# Patient Record
Sex: Female | Born: 1985 | Race: White | Hispanic: No | Marital: Single | Smoking: Never smoker
Health system: Southern US, Community
[De-identification: ages and names within clinical notes are randomized; demographics above are authoritative.]

## PROBLEM LIST (undated history)

## (undated) DIAGNOSIS — F419 Anxiety disorder, unspecified: Secondary | ICD-10-CM

## (undated) DIAGNOSIS — F32A Depression, unspecified: Secondary | ICD-10-CM

## (undated) DIAGNOSIS — E039 Hypothyroidism, unspecified: Secondary | ICD-10-CM

## (undated) DIAGNOSIS — F329 Major depressive disorder, single episode, unspecified: Secondary | ICD-10-CM

## (undated) DIAGNOSIS — U071 COVID-19: Secondary | ICD-10-CM

## (undated) HISTORY — DX: Depression, unspecified: F32.A

## (undated) HISTORY — DX: COVID-19: U07.1

## (undated) HISTORY — DX: Hypothyroidism, unspecified: E03.9

## (undated) HISTORY — DX: Anxiety disorder, unspecified: F41.9

---
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
## 2005-01-20 ENCOUNTER — Ambulatory Visit: Payer: Self-pay | Admitting: Psychology

## 2005-03-12 ENCOUNTER — Ambulatory Visit: Payer: Self-pay | Admitting: Psychology

## 2005-05-29 ENCOUNTER — Ambulatory Visit: Payer: Self-pay | Admitting: Psychology

## 2005-07-29 ENCOUNTER — Ambulatory Visit: Payer: Self-pay | Admitting: Psychology

## 2005-08-05 ENCOUNTER — Ambulatory Visit: Payer: Self-pay | Admitting: Psychology

## 2005-09-15 ENCOUNTER — Ambulatory Visit: Payer: Self-pay | Admitting: Psychology

## 2018-06-24 DIAGNOSIS — Z0000 Encounter for general adult medical examination without abnormal findings: Secondary | ICD-10-CM | POA: Diagnosis not present

## 2018-06-24 DIAGNOSIS — R300 Dysuria: Secondary | ICD-10-CM | POA: Diagnosis not present

## 2018-06-24 DIAGNOSIS — N951 Menopausal and female climacteric states: Secondary | ICD-10-CM | POA: Diagnosis not present

## 2018-06-24 DIAGNOSIS — R358 Other polyuria: Secondary | ICD-10-CM | POA: Diagnosis not present

## 2018-06-24 DIAGNOSIS — R5383 Other fatigue: Secondary | ICD-10-CM | POA: Diagnosis not present

## 2018-06-24 DIAGNOSIS — E559 Vitamin D deficiency, unspecified: Secondary | ICD-10-CM | POA: Diagnosis not present

## 2018-07-16 DIAGNOSIS — S99912A Unspecified injury of left ankle, initial encounter: Secondary | ICD-10-CM | POA: Diagnosis not present

## 2018-07-16 DIAGNOSIS — S8012XA Contusion of left lower leg, initial encounter: Secondary | ICD-10-CM | POA: Diagnosis not present

## 2018-07-16 DIAGNOSIS — S8992XA Unspecified injury of left lower leg, initial encounter: Secondary | ICD-10-CM | POA: Diagnosis not present

## 2018-07-16 DIAGNOSIS — Y92414 Local residential or business street as the place of occurrence of the external cause: Secondary | ICD-10-CM | POA: Diagnosis not present

## 2018-07-16 DIAGNOSIS — M79662 Pain in left lower leg: Secondary | ICD-10-CM | POA: Diagnosis not present

## 2018-08-12 DIAGNOSIS — M9901 Segmental and somatic dysfunction of cervical region: Secondary | ICD-10-CM | POA: Diagnosis not present

## 2018-08-12 DIAGNOSIS — M9902 Segmental and somatic dysfunction of thoracic region: Secondary | ICD-10-CM | POA: Diagnosis not present

## 2018-08-12 DIAGNOSIS — M9903 Segmental and somatic dysfunction of lumbar region: Secondary | ICD-10-CM | POA: Diagnosis not present

## 2018-08-12 DIAGNOSIS — S138XXA Sprain of joints and ligaments of other parts of neck, initial encounter: Secondary | ICD-10-CM | POA: Diagnosis not present

## 2018-08-16 DIAGNOSIS — S138XXA Sprain of joints and ligaments of other parts of neck, initial encounter: Secondary | ICD-10-CM | POA: Diagnosis not present

## 2018-08-16 DIAGNOSIS — M9903 Segmental and somatic dysfunction of lumbar region: Secondary | ICD-10-CM | POA: Diagnosis not present

## 2018-08-16 DIAGNOSIS — M9901 Segmental and somatic dysfunction of cervical region: Secondary | ICD-10-CM | POA: Diagnosis not present

## 2018-08-16 DIAGNOSIS — M9902 Segmental and somatic dysfunction of thoracic region: Secondary | ICD-10-CM | POA: Diagnosis not present

## 2018-08-17 DIAGNOSIS — M9903 Segmental and somatic dysfunction of lumbar region: Secondary | ICD-10-CM | POA: Diagnosis not present

## 2018-08-17 DIAGNOSIS — M9902 Segmental and somatic dysfunction of thoracic region: Secondary | ICD-10-CM | POA: Diagnosis not present

## 2018-08-17 DIAGNOSIS — M9901 Segmental and somatic dysfunction of cervical region: Secondary | ICD-10-CM | POA: Diagnosis not present

## 2018-08-17 DIAGNOSIS — S138XXA Sprain of joints and ligaments of other parts of neck, initial encounter: Secondary | ICD-10-CM | POA: Diagnosis not present

## 2018-08-18 DIAGNOSIS — S138XXA Sprain of joints and ligaments of other parts of neck, initial encounter: Secondary | ICD-10-CM | POA: Diagnosis not present

## 2018-08-18 DIAGNOSIS — M9903 Segmental and somatic dysfunction of lumbar region: Secondary | ICD-10-CM | POA: Diagnosis not present

## 2018-08-18 DIAGNOSIS — M9902 Segmental and somatic dysfunction of thoracic region: Secondary | ICD-10-CM | POA: Diagnosis not present

## 2018-08-18 DIAGNOSIS — M9901 Segmental and somatic dysfunction of cervical region: Secondary | ICD-10-CM | POA: Diagnosis not present

## 2018-08-19 DIAGNOSIS — M9901 Segmental and somatic dysfunction of cervical region: Secondary | ICD-10-CM | POA: Diagnosis not present

## 2018-08-19 DIAGNOSIS — S138XXA Sprain of joints and ligaments of other parts of neck, initial encounter: Secondary | ICD-10-CM | POA: Diagnosis not present

## 2018-08-19 DIAGNOSIS — M9902 Segmental and somatic dysfunction of thoracic region: Secondary | ICD-10-CM | POA: Diagnosis not present

## 2018-08-19 DIAGNOSIS — M9903 Segmental and somatic dysfunction of lumbar region: Secondary | ICD-10-CM | POA: Diagnosis not present

## 2018-08-24 DIAGNOSIS — M9903 Segmental and somatic dysfunction of lumbar region: Secondary | ICD-10-CM | POA: Diagnosis not present

## 2018-08-24 DIAGNOSIS — M9902 Segmental and somatic dysfunction of thoracic region: Secondary | ICD-10-CM | POA: Diagnosis not present

## 2018-08-24 DIAGNOSIS — S138XXA Sprain of joints and ligaments of other parts of neck, initial encounter: Secondary | ICD-10-CM | POA: Diagnosis not present

## 2018-08-24 DIAGNOSIS — M9901 Segmental and somatic dysfunction of cervical region: Secondary | ICD-10-CM | POA: Diagnosis not present

## 2018-08-25 DIAGNOSIS — M9903 Segmental and somatic dysfunction of lumbar region: Secondary | ICD-10-CM | POA: Diagnosis not present

## 2018-08-25 DIAGNOSIS — S138XXA Sprain of joints and ligaments of other parts of neck, initial encounter: Secondary | ICD-10-CM | POA: Diagnosis not present

## 2018-08-25 DIAGNOSIS — M9902 Segmental and somatic dysfunction of thoracic region: Secondary | ICD-10-CM | POA: Diagnosis not present

## 2018-08-25 DIAGNOSIS — M9901 Segmental and somatic dysfunction of cervical region: Secondary | ICD-10-CM | POA: Diagnosis not present

## 2018-08-26 DIAGNOSIS — M9903 Segmental and somatic dysfunction of lumbar region: Secondary | ICD-10-CM | POA: Diagnosis not present

## 2018-08-26 DIAGNOSIS — M9902 Segmental and somatic dysfunction of thoracic region: Secondary | ICD-10-CM | POA: Diagnosis not present

## 2018-08-26 DIAGNOSIS — S138XXA Sprain of joints and ligaments of other parts of neck, initial encounter: Secondary | ICD-10-CM | POA: Diagnosis not present

## 2018-08-26 DIAGNOSIS — M9901 Segmental and somatic dysfunction of cervical region: Secondary | ICD-10-CM | POA: Diagnosis not present

## 2018-08-30 DIAGNOSIS — M9903 Segmental and somatic dysfunction of lumbar region: Secondary | ICD-10-CM | POA: Diagnosis not present

## 2018-08-30 DIAGNOSIS — M9902 Segmental and somatic dysfunction of thoracic region: Secondary | ICD-10-CM | POA: Diagnosis not present

## 2018-08-30 DIAGNOSIS — S138XXA Sprain of joints and ligaments of other parts of neck, initial encounter: Secondary | ICD-10-CM | POA: Diagnosis not present

## 2018-08-30 DIAGNOSIS — M9901 Segmental and somatic dysfunction of cervical region: Secondary | ICD-10-CM | POA: Diagnosis not present

## 2018-08-31 DIAGNOSIS — S138XXA Sprain of joints and ligaments of other parts of neck, initial encounter: Secondary | ICD-10-CM | POA: Diagnosis not present

## 2018-08-31 DIAGNOSIS — M9903 Segmental and somatic dysfunction of lumbar region: Secondary | ICD-10-CM | POA: Diagnosis not present

## 2018-08-31 DIAGNOSIS — M9901 Segmental and somatic dysfunction of cervical region: Secondary | ICD-10-CM | POA: Diagnosis not present

## 2018-08-31 DIAGNOSIS — M9902 Segmental and somatic dysfunction of thoracic region: Secondary | ICD-10-CM | POA: Diagnosis not present

## 2018-09-01 DIAGNOSIS — M9901 Segmental and somatic dysfunction of cervical region: Secondary | ICD-10-CM | POA: Diagnosis not present

## 2018-09-01 DIAGNOSIS — M9903 Segmental and somatic dysfunction of lumbar region: Secondary | ICD-10-CM | POA: Diagnosis not present

## 2018-09-01 DIAGNOSIS — S138XXA Sprain of joints and ligaments of other parts of neck, initial encounter: Secondary | ICD-10-CM | POA: Diagnosis not present

## 2018-09-01 DIAGNOSIS — M9902 Segmental and somatic dysfunction of thoracic region: Secondary | ICD-10-CM | POA: Diagnosis not present

## 2018-09-03 ENCOUNTER — Emergency Department (HOSPITAL_COMMUNITY): Payer: BLUE CROSS/BLUE SHIELD

## 2018-09-03 ENCOUNTER — Encounter (HOSPITAL_COMMUNITY): Payer: Self-pay | Admitting: Emergency Medicine

## 2018-09-03 ENCOUNTER — Other Ambulatory Visit: Payer: Self-pay

## 2018-09-03 ENCOUNTER — Emergency Department (HOSPITAL_COMMUNITY)
Admission: EM | Admit: 2018-09-03 | Discharge: 2018-09-03 | Disposition: A | Discharge status: Home / Self Care | Payer: BLUE CROSS/BLUE SHIELD | Attending: Emergency Medicine | Admitting: Emergency Medicine

## 2018-09-03 DIAGNOSIS — N200 Calculus of kidney: Secondary | ICD-10-CM | POA: Diagnosis not present

## 2018-09-03 DIAGNOSIS — R1031 Right lower quadrant pain: Secondary | ICD-10-CM | POA: Diagnosis not present

## 2018-09-03 DIAGNOSIS — Z79899 Other long term (current) drug therapy: Secondary | ICD-10-CM | POA: Diagnosis not present

## 2018-09-03 DIAGNOSIS — N132 Hydronephrosis with renal and ureteral calculous obstruction: Secondary | ICD-10-CM | POA: Diagnosis not present

## 2018-09-03 LAB — URINALYSIS, ROUTINE W REFLEX MICROSCOPIC
Bilirubin Urine: NEGATIVE
Glucose, UA: NEGATIVE mg/dL
Ketones, ur: NEGATIVE mg/dL
Leukocytes, UA: NEGATIVE
NITRITE: NEGATIVE
Protein, ur: NEGATIVE mg/dL
RBC / HPF: >50 RBC/hpf — ABNORMAL HIGH (ref 0–5)
SPECIFIC GRAVITY, URINE: 1.02 (ref 1.005–1.030)
pH: 5 (ref 5.0–8.0)

## 2018-09-03 LAB — PREGNANCY, URINE: PREG TEST UR: NEGATIVE

## 2018-09-03 MED ORDER — IBUPROFEN 400 MG PO TABS: 400.0000 mg | ORAL_TABLET | Freq: Three times a day (TID) | ORAL | 0 refills | Status: AC

## 2018-09-03 MED ORDER — ONDANSETRON 4 MG PO TBDP: 4.0000 mg | ORAL_TABLET | Freq: Three times a day (TID) | ORAL | 0 refills | Status: DC | PRN

## 2018-09-03 MED ORDER — SODIUM CHLORIDE 0.9 % IV BOLUS
500.0000 mL | Freq: Once | INTRAVENOUS | Status: AC
  Administered 2018-09-03: 500 mL via INTRAVENOUS

## 2018-09-03 MED ORDER — KETOROLAC TROMETHAMINE 30 MG/ML IJ SOLN
15.0000 mg | Freq: Once | INTRAMUSCULAR | Status: AC
  Administered 2018-09-03: 15 mg via INTRAVENOUS
  Filled 2018-09-03: qty 1

## 2018-09-03 MED ORDER — TRAMADOL HCL 50 MG PO TABS: 50.0000 mg | ORAL_TABLET | Freq: Four times a day (QID) | ORAL | 0 refills | Status: DC | PRN

## 2018-09-03 MED ORDER — ONDANSETRON HCL 4 MG/2ML IJ SOLN
4.0000 mg | Freq: Once | INTRAMUSCULAR | Status: AC
  Administered 2018-09-03: 4 mg via INTRAVENOUS
  Filled 2018-09-03: qty 2

## 2018-09-03 NOTE — ED Provider Notes (Signed)
[facility]-EMERGENCY DEPT Provider Note   CSN: [id number] Arrival date & time: 09/03/18  4098     History   Chief Complaint Chief Complaint  Patient presents with  . Flank Pain    HPI Gail Price is a 32 y.o. female.  HPI Previously well young female presents with new right flank pain. Onset was about 5 hours ago, initially with mild discomfort, nausea, but she subsequently developed sharp severe pain in the right flank, with some radiation towards the right lateral abdomen. She has sensation of urinary frequency with minimal production. There is associated generalized discomfort, no vomiting, no diarrhea. She has taken to 200 mg tablets of ibuprofen, without appreciable change in her condition. No history of kidney stone, but she does have a history of kidney infections within the past 18 months. No other chronic medical issues.  History reviewed. No pertinent past medical history.  There are no active problems to display for this patient.   History reviewed. No pertinent surgical history.   OB History   None      Home Medications    Prior to Admission medications   Medication Sig Start Date End Date Taking? Authorizing Provider  Multiple Vitamin (MULTIVITAMIN WITH MINERALS) TABS tablet Take 1 tablet by mouth daily.   Yes [provider]  ibuprofen (ADVIL,MOTRIN) 400 MG tablet Take 1 tablet (400 mg total) by mouth 3 (three) times daily for 3 days. Take one tablet three times daily for three days 09/03/18 09/06/18  Gerhard Munch, MD  ondansetron (ZOFRAN ODT) 4 MG disintegrating tablet Take 1 tablet (4 mg total) by mouth every 8 (eight) hours as needed for nausea or vomiting. 09/03/18   Gerhard Munch, MD  traMADol (ULTRAM) 50 MG tablet Take 1 tablet (50 mg total) by mouth every 6 (six) hours as needed. 09/03/18   Gerhard Munch, MD    Family History No family history on file.  Social History Social History   Tobacco  Use  . Smoking status: Never Smoker  . Smokeless tobacco: Never Used  Substance Use Topics  . Alcohol use: Not Currently  . Drug use: Not Currently     Allergies   Patient has no known allergies.   Review of Systems Review of Systems  Constitutional:       Per HPI, otherwise negative  HENT:       Per HPI, otherwise negative  Respiratory:       Per HPI, otherwise negative  Cardiovascular:       Per HPI, otherwise negative  Gastrointestinal: Positive for nausea. Negative for vomiting.  Endocrine:       Negative aside from HPI  Genitourinary:       Neg aside from HPI   Musculoskeletal:       Per HPI, otherwise negative  Skin: Negative.   Neurological: Negative for syncope.     Physical Exam Updated Vital Signs BP 117/72   Pulse 94   Temp 98.1 F (36.7 C)   Resp 17   LMP 08/31/2018   SpO2 100%   Physical Exam  Constitutional: She is oriented to person, place, and time. She appears well-developed and well-nourished. No distress.  HENT:  Head: Normocephalic and atraumatic.  Eyes: Conjunctivae and EOM are normal.  Cardiovascular: Normal rate and regular rhythm.  Pulmonary/Chest: Effort normal and breath sounds normal. No stridor. No respiratory distress.  Abdominal: She exhibits no distension. There is no tenderness. There is no guarding.  Mild right CVA tenderness  Musculoskeletal: She exhibits no edema.  Neurological: She is alert and oriented to person, place, and time. No cranial nerve deficit.  Skin: Skin is warm and dry.  Psychiatric: She has a normal mood and affect.  Nursing note and vitals reviewed.    ED Treatments / Results  Labs (all labs ordered are listed, but only abnormal results are displayed) Labs Reviewed  URINALYSIS, ROUTINE W REFLEX MICROSCOPIC - Abnormal; Notable for the following components:      Result Value   APPearance HAZY (*)    Hgb urine dipstick LARGE (*)    RBC / HPF >50 (*)    Bacteria, UA RARE (*)    All other  components within normal limits  PREGNANCY, URINE  POC URINE PREG, ED   Radiology Ct Renal Stone Study  Result Date: 09/03/2018 CLINICAL DATA:  Flank pain there is bibasilar atelectatic change. No edema or consolidation. EXAM: CT ABDOMEN AND PELVIS WITHOUT CONTRAST TECHNIQUE: Multidetector CT imaging of the abdomen and pelvis was performed following the standard protocol without IV contrast. COMPARISON:  No focal liver lesions are evident on this noncontrast enhanced study. Gallbladder wall is not appreciably thickened. There is no biliary duct dilatation. FINDINGS: Lower chest: Lung bases are clear. Hepatobiliary: No focal liver lesions are appreciable. Gallbladder wall is not thickened. No biliary duct dilatation. Pancreas: No pancreatic mass or inflammatory focus. Spleen: No splenic lesions evident. Adrenals/Urinary Tract: Adrenals appear normal bilaterally. There is no renal mass on either side. There is a junctional parenchymal defect on each side, an anatomic variant. There is moderate hydronephrosis on the right. There is no hydronephrosis on the left. There is a 2 mm calculus in the upper pole of the right kidney. There is a 2 mm calculus in the lower pole of the right kidney. There is a 1 mm calculus in the upper pole left kidney. There is a 3 mm calculus just proximal to the right ureterovesical junction. No other ureteral calculi are evident. Urinary bladder is midline with wall thickness within normal limits. Stomach/Bowel: There is no appreciable bowel wall or mesenteric thickening. No evident bowel obstruction. No free air or portal venous air. Vascular/Lymphatic: There is no abdominal aortic aneurysm. No vascular lesions are evident on this noncontrast enhanced studies. There is a retroaortic left renal vein, an anatomic variant. There is no adenopathy in the abdomen or pelvis. Reproductive: Uterus is anteverted. Tampon in place. No pelvic mass evident. Other: Appendix appears unremarkable.  There is no abscess or ascites in the abdomen or pelvis. Musculoskeletal: There are no blastic or lytic bone lesions. There is a tiny bone island in the right femoral head. No intramuscular or abdominal wall lesions. IMPRESSION: 1. 3 mm calculus just proximal to the right ureterovesical junction causing moderate hydronephrosis on the right. 2.  Small nonobstructing calculi in each kidney. 3. No evident bowel obstruction. No abscess in the abdomen pelvis. Appendix appears normal. Electronically Signed   By: Bretta Bang III M.D.   On: 09/03/2018 09:37    Procedures Procedures (including critical care time)  Medications Ordered in ED Medications  sodium chloride 0.9 % bolus 500 mL (0 mLs Intravenous Stopped 09/03/18 0831)  ondansetron (ZOFRAN) injection 4 mg (4 mg Intravenous Given 09/03/18 0745)  ketorolac (TORADOL) 30 MG/ML injection 15 mg (15 mg Intravenous Given 09/03/18 0745)     Initial Impression / Assessment and Plan / ED Course  I have reviewed the triage vital signs and the nursing notes.  Pertinent labs & imaging  results that were available during my care of the patient were reviewed by me and considered in my medical decision making (see chart for details).     10:03 AM Patient substantially better, resting comfortably. We discussed all findings, and I have reviewed her CT and labs. No evidence for obstruction or infection, but there is evidence for a 3 mm stone, as well as multiple other smaller intrarenal calculi. With improvement here, patient will follow-up with urology as an outpatient.  Final Clinical Impressions(s) / ED Diagnoses   Final diagnoses:  Nephrolithiasis    ED Discharge Orders         Ordered    ibuprofen (ADVIL,MOTRIN) 400 MG tablet  3 times daily     09/03/18 0959    ondansetron (ZOFRAN ODT) 4 MG disintegrating tablet  Every 8 hours PRN     09/03/18 0959    traMADol (ULTRAM) 50 MG tablet  Every 6 hours PRN     09/03/18 0959             Gerhard Munch, MD 09/03/18 1007

## 2018-09-03 NOTE — Discharge Instructions (Signed)
Please be sure to follow up with a urologist.  Return here for concerning changes in your condition.  This is your CT report:    CT Renal Stone Study (Final result)  Result time 09/03/18 09:37:05  Final result by Arther Dames, MD (09/03/18 09:37:05)           Narrative:   CLINICAL DATA:  Flank pain there is bibasilar atelectatic change. No edema or consolidation.  EXAM: CT ABDOMEN AND PELVIS WITHOUT CONTRAST  TECHNIQUE: Multidetector CT imaging of the abdomen and pelvis was performed following the standard protocol without IV contrast.  COMPARISON:  No focal liver lesions are evident on this noncontrast enhanced study. Gallbladder wall is not appreciably thickened. There is no biliary duct dilatation.  FINDINGS: Lower chest: Lung bases are clear.  Hepatobiliary: No focal liver lesions are appreciable. Gallbladder wall is not thickened. No biliary duct dilatation.  Pancreas: No pancreatic mass or inflammatory focus.  Spleen: No splenic lesions evident.  Adrenals/Urinary Tract: Adrenals appear normal bilaterally. There is no renal mass on either side. There is a junctional parenchymal defect on each side, an anatomic variant. There is moderate hydronephrosis on the right. There is no hydronephrosis on the left. There is a 2 mm calculus in the upper pole of the right kidney. There is a 2 mm calculus in the lower pole of the right kidney. There is a 1 mm calculus in the upper pole left kidney. There is a 3 mm calculus just proximal to the right ureterovesical junction. No other ureteral calculi are evident. Urinary bladder is midline with wall thickness within normal limits.  Stomach/Bowel: There is no appreciable bowel wall or mesenteric thickening. No evident bowel obstruction. No free air or portal venous air.  Vascular/Lymphatic: There is no abdominal aortic aneurysm. No vascular lesions are evident on this noncontrast enhanced studies. There is a  retroaortic left renal vein, an anatomic variant. There is no adenopathy in the abdomen or pelvis.  Reproductive: Uterus is anteverted. Tampon in place. No pelvic mass evident.  Other: Appendix appears unremarkable. There is no abscess or ascites in the abdomen or pelvis.  Musculoskeletal: There are no blastic or lytic bone lesions. There is a tiny bone island in the right femoral head. No intramuscular or abdominal wall lesions.  IMPRESSION: 1. 3 mm calculus just proximal to the right ureterovesical junction causing moderate hydronephrosis on the right.  2.  Small nonobstructing calculi in each kidney.  3. No evident bowel obstruction. No abscess in the abdomen pelvis. Appendix appears normal.   Electronically Signed   By: Bretta Bang III M.D.   On: 09/03/2018 09:37

## 2018-09-03 NOTE — ED Triage Notes (Signed)
Pt from home with c/o right flank pain. Pt states pain is 7/10 and began yesterday. Pain radiates to front of groin and to hip. Pt states moving exacerbates pain. Pt denies fever but reports feeling chills. Pt ambulatory without assistance.

## 2018-09-07 DIAGNOSIS — N202 Calculus of kidney with calculus of ureter: Secondary | ICD-10-CM | POA: Diagnosis not present

## 2018-09-13 DIAGNOSIS — S138XXA Sprain of joints and ligaments of other parts of neck, initial encounter: Secondary | ICD-10-CM | POA: Diagnosis not present

## 2018-09-13 DIAGNOSIS — M9902 Segmental and somatic dysfunction of thoracic region: Secondary | ICD-10-CM | POA: Diagnosis not present

## 2018-09-13 DIAGNOSIS — M9903 Segmental and somatic dysfunction of lumbar region: Secondary | ICD-10-CM | POA: Diagnosis not present

## 2018-09-13 DIAGNOSIS — M9901 Segmental and somatic dysfunction of cervical region: Secondary | ICD-10-CM | POA: Diagnosis not present

## 2018-09-14 DIAGNOSIS — M9902 Segmental and somatic dysfunction of thoracic region: Secondary | ICD-10-CM | POA: Diagnosis not present

## 2018-09-14 DIAGNOSIS — S138XXA Sprain of joints and ligaments of other parts of neck, initial encounter: Secondary | ICD-10-CM | POA: Diagnosis not present

## 2018-09-14 DIAGNOSIS — M9903 Segmental and somatic dysfunction of lumbar region: Secondary | ICD-10-CM | POA: Diagnosis not present

## 2018-09-14 DIAGNOSIS — M9901 Segmental and somatic dysfunction of cervical region: Secondary | ICD-10-CM | POA: Diagnosis not present

## 2018-09-16 DIAGNOSIS — S138XXA Sprain of joints and ligaments of other parts of neck, initial encounter: Secondary | ICD-10-CM | POA: Diagnosis not present

## 2018-09-16 DIAGNOSIS — M9902 Segmental and somatic dysfunction of thoracic region: Secondary | ICD-10-CM | POA: Diagnosis not present

## 2018-09-16 DIAGNOSIS — M9903 Segmental and somatic dysfunction of lumbar region: Secondary | ICD-10-CM | POA: Diagnosis not present

## 2018-09-16 DIAGNOSIS — M9901 Segmental and somatic dysfunction of cervical region: Secondary | ICD-10-CM | POA: Diagnosis not present

## 2018-09-20 DIAGNOSIS — N202 Calculus of kidney with calculus of ureter: Secondary | ICD-10-CM | POA: Diagnosis not present

## 2018-09-20 DIAGNOSIS — N201 Calculus of ureter: Secondary | ICD-10-CM | POA: Diagnosis not present

## 2018-09-21 DIAGNOSIS — M9902 Segmental and somatic dysfunction of thoracic region: Secondary | ICD-10-CM | POA: Diagnosis not present

## 2018-09-21 DIAGNOSIS — M9901 Segmental and somatic dysfunction of cervical region: Secondary | ICD-10-CM | POA: Diagnosis not present

## 2018-09-21 DIAGNOSIS — M9903 Segmental and somatic dysfunction of lumbar region: Secondary | ICD-10-CM | POA: Diagnosis not present

## 2018-09-21 DIAGNOSIS — S138XXA Sprain of joints and ligaments of other parts of neck, initial encounter: Secondary | ICD-10-CM | POA: Diagnosis not present

## 2018-09-23 DIAGNOSIS — M9902 Segmental and somatic dysfunction of thoracic region: Secondary | ICD-10-CM | POA: Diagnosis not present

## 2018-09-23 DIAGNOSIS — M9901 Segmental and somatic dysfunction of cervical region: Secondary | ICD-10-CM | POA: Diagnosis not present

## 2018-09-23 DIAGNOSIS — M9903 Segmental and somatic dysfunction of lumbar region: Secondary | ICD-10-CM | POA: Diagnosis not present

## 2018-09-23 DIAGNOSIS — S138XXA Sprain of joints and ligaments of other parts of neck, initial encounter: Secondary | ICD-10-CM | POA: Diagnosis not present

## 2018-09-28 DIAGNOSIS — M9902 Segmental and somatic dysfunction of thoracic region: Secondary | ICD-10-CM | POA: Diagnosis not present

## 2018-09-28 DIAGNOSIS — N201 Calculus of ureter: Secondary | ICD-10-CM | POA: Diagnosis not present

## 2018-09-28 DIAGNOSIS — M9903 Segmental and somatic dysfunction of lumbar region: Secondary | ICD-10-CM | POA: Diagnosis not present

## 2018-09-28 DIAGNOSIS — M9901 Segmental and somatic dysfunction of cervical region: Secondary | ICD-10-CM | POA: Diagnosis not present

## 2018-09-28 DIAGNOSIS — S138XXA Sprain of joints and ligaments of other parts of neck, initial encounter: Secondary | ICD-10-CM | POA: Diagnosis not present

## 2018-10-04 DIAGNOSIS — S138XXA Sprain of joints and ligaments of other parts of neck, initial encounter: Secondary | ICD-10-CM | POA: Diagnosis not present

## 2018-10-04 DIAGNOSIS — M9902 Segmental and somatic dysfunction of thoracic region: Secondary | ICD-10-CM | POA: Diagnosis not present

## 2018-10-04 DIAGNOSIS — M9903 Segmental and somatic dysfunction of lumbar region: Secondary | ICD-10-CM | POA: Diagnosis not present

## 2018-10-04 DIAGNOSIS — M9901 Segmental and somatic dysfunction of cervical region: Secondary | ICD-10-CM | POA: Diagnosis not present

## 2018-10-06 DIAGNOSIS — M9903 Segmental and somatic dysfunction of lumbar region: Secondary | ICD-10-CM | POA: Diagnosis not present

## 2018-10-06 DIAGNOSIS — S138XXA Sprain of joints and ligaments of other parts of neck, initial encounter: Secondary | ICD-10-CM | POA: Diagnosis not present

## 2018-10-06 DIAGNOSIS — M9902 Segmental and somatic dysfunction of thoracic region: Secondary | ICD-10-CM | POA: Diagnosis not present

## 2018-10-06 DIAGNOSIS — M9901 Segmental and somatic dysfunction of cervical region: Secondary | ICD-10-CM | POA: Diagnosis not present

## 2018-10-11 DIAGNOSIS — S138XXA Sprain of joints and ligaments of other parts of neck, initial encounter: Secondary | ICD-10-CM | POA: Diagnosis not present

## 2018-10-11 DIAGNOSIS — M9902 Segmental and somatic dysfunction of thoracic region: Secondary | ICD-10-CM | POA: Diagnosis not present

## 2018-10-11 DIAGNOSIS — M9903 Segmental and somatic dysfunction of lumbar region: Secondary | ICD-10-CM | POA: Diagnosis not present

## 2018-10-11 DIAGNOSIS — M9901 Segmental and somatic dysfunction of cervical region: Secondary | ICD-10-CM | POA: Diagnosis not present

## 2018-10-13 DIAGNOSIS — M9901 Segmental and somatic dysfunction of cervical region: Secondary | ICD-10-CM | POA: Diagnosis not present

## 2018-10-13 DIAGNOSIS — M9902 Segmental and somatic dysfunction of thoracic region: Secondary | ICD-10-CM | POA: Diagnosis not present

## 2018-10-13 DIAGNOSIS — S138XXA Sprain of joints and ligaments of other parts of neck, initial encounter: Secondary | ICD-10-CM | POA: Diagnosis not present

## 2018-10-13 DIAGNOSIS — M9903 Segmental and somatic dysfunction of lumbar region: Secondary | ICD-10-CM | POA: Diagnosis not present

## 2018-11-25 DIAGNOSIS — M549 Dorsalgia, unspecified: Secondary | ICD-10-CM | POA: Diagnosis not present

## 2018-11-25 DIAGNOSIS — M129 Arthropathy, unspecified: Secondary | ICD-10-CM | POA: Diagnosis not present

## 2018-11-25 DIAGNOSIS — E559 Vitamin D deficiency, unspecified: Secondary | ICD-10-CM | POA: Diagnosis not present

## 2018-11-25 DIAGNOSIS — R5383 Other fatigue: Secondary | ICD-10-CM | POA: Diagnosis not present

## 2018-11-25 DIAGNOSIS — E538 Deficiency of other specified B group vitamins: Secondary | ICD-10-CM | POA: Diagnosis not present

## 2018-11-25 DIAGNOSIS — Z79899 Other long term (current) drug therapy: Secondary | ICD-10-CM | POA: Diagnosis not present

## 2018-12-03 DIAGNOSIS — M5388 Other specified dorsopathies, sacral and sacrococcygeal region: Secondary | ICD-10-CM | POA: Diagnosis not present

## 2018-12-03 DIAGNOSIS — M545 Low back pain: Secondary | ICD-10-CM | POA: Diagnosis not present

## 2018-12-03 DIAGNOSIS — M2560 Stiffness of unspecified joint, not elsewhere classified: Secondary | ICD-10-CM | POA: Diagnosis not present

## 2018-12-03 DIAGNOSIS — M629 Disorder of muscle, unspecified: Secondary | ICD-10-CM | POA: Diagnosis not present

## 2018-12-07 DIAGNOSIS — M629 Disorder of muscle, unspecified: Secondary | ICD-10-CM | POA: Diagnosis not present

## 2018-12-07 DIAGNOSIS — M5388 Other specified dorsopathies, sacral and sacrococcygeal region: Secondary | ICD-10-CM | POA: Diagnosis not present

## 2018-12-07 DIAGNOSIS — M2560 Stiffness of unspecified joint, not elsewhere classified: Secondary | ICD-10-CM | POA: Diagnosis not present

## 2018-12-07 DIAGNOSIS — M545 Low back pain: Secondary | ICD-10-CM | POA: Diagnosis not present

## 2018-12-09 DIAGNOSIS — M629 Disorder of muscle, unspecified: Secondary | ICD-10-CM | POA: Diagnosis not present

## 2018-12-09 DIAGNOSIS — M545 Low back pain: Secondary | ICD-10-CM | POA: Diagnosis not present

## 2018-12-09 DIAGNOSIS — M2560 Stiffness of unspecified joint, not elsewhere classified: Secondary | ICD-10-CM | POA: Diagnosis not present

## 2018-12-09 DIAGNOSIS — M5388 Other specified dorsopathies, sacral and sacrococcygeal region: Secondary | ICD-10-CM | POA: Diagnosis not present

## 2018-12-10 DIAGNOSIS — Z124 Encounter for screening for malignant neoplasm of cervix: Secondary | ICD-10-CM | POA: Diagnosis not present

## 2018-12-10 DIAGNOSIS — N926 Irregular menstruation, unspecified: Secondary | ICD-10-CM | POA: Diagnosis not present

## 2018-12-10 DIAGNOSIS — N760 Acute vaginitis: Secondary | ICD-10-CM | POA: Diagnosis not present

## 2018-12-10 DIAGNOSIS — Z1151 Encounter for screening for human papillomavirus (HPV): Secondary | ICD-10-CM | POA: Diagnosis not present

## 2018-12-10 DIAGNOSIS — N943 Premenstrual tension syndrome: Secondary | ICD-10-CM | POA: Diagnosis not present

## 2018-12-16 DIAGNOSIS — M545 Low back pain: Secondary | ICD-10-CM | POA: Diagnosis not present

## 2018-12-16 DIAGNOSIS — M2560 Stiffness of unspecified joint, not elsewhere classified: Secondary | ICD-10-CM | POA: Diagnosis not present

## 2018-12-16 DIAGNOSIS — M629 Disorder of muscle, unspecified: Secondary | ICD-10-CM | POA: Diagnosis not present

## 2018-12-16 DIAGNOSIS — M5388 Other specified dorsopathies, sacral and sacrococcygeal region: Secondary | ICD-10-CM | POA: Diagnosis not present

## 2018-12-17 DIAGNOSIS — R9389 Abnormal findings on diagnostic imaging of other specified body structures: Secondary | ICD-10-CM | POA: Diagnosis not present

## 2018-12-17 DIAGNOSIS — E282 Polycystic ovarian syndrome: Secondary | ICD-10-CM | POA: Diagnosis not present

## 2018-12-17 DIAGNOSIS — Z01419 Encounter for gynecological examination (general) (routine) without abnormal findings: Secondary | ICD-10-CM | POA: Diagnosis not present

## 2018-12-21 DIAGNOSIS — M2560 Stiffness of unspecified joint, not elsewhere classified: Secondary | ICD-10-CM | POA: Diagnosis not present

## 2018-12-21 DIAGNOSIS — M545 Low back pain: Secondary | ICD-10-CM | POA: Diagnosis not present

## 2018-12-21 DIAGNOSIS — M629 Disorder of muscle, unspecified: Secondary | ICD-10-CM | POA: Diagnosis not present

## 2018-12-21 DIAGNOSIS — M5388 Other specified dorsopathies, sacral and sacrococcygeal region: Secondary | ICD-10-CM | POA: Diagnosis not present

## 2018-12-22 DIAGNOSIS — Z3202 Encounter for pregnancy test, result negative: Secondary | ICD-10-CM | POA: Diagnosis not present

## 2018-12-22 DIAGNOSIS — R9389 Abnormal findings on diagnostic imaging of other specified body structures: Secondary | ICD-10-CM | POA: Diagnosis not present

## 2018-12-23 DIAGNOSIS — M545 Low back pain: Secondary | ICD-10-CM | POA: Diagnosis not present

## 2018-12-23 DIAGNOSIS — M629 Disorder of muscle, unspecified: Secondary | ICD-10-CM | POA: Diagnosis not present

## 2018-12-23 DIAGNOSIS — M2560 Stiffness of unspecified joint, not elsewhere classified: Secondary | ICD-10-CM | POA: Diagnosis not present

## 2018-12-23 DIAGNOSIS — M5388 Other specified dorsopathies, sacral and sacrococcygeal region: Secondary | ICD-10-CM | POA: Diagnosis not present

## 2019-01-07 DIAGNOSIS — M629 Disorder of muscle, unspecified: Secondary | ICD-10-CM | POA: Diagnosis not present

## 2019-01-07 DIAGNOSIS — M2560 Stiffness of unspecified joint, not elsewhere classified: Secondary | ICD-10-CM | POA: Diagnosis not present

## 2019-01-07 DIAGNOSIS — M545 Low back pain: Secondary | ICD-10-CM | POA: Diagnosis not present

## 2019-01-07 DIAGNOSIS — M5388 Other specified dorsopathies, sacral and sacrococcygeal region: Secondary | ICD-10-CM | POA: Diagnosis not present

## 2019-01-31 DIAGNOSIS — B373 Candidiasis of vulva and vagina: Secondary | ICD-10-CM | POA: Diagnosis not present

## 2019-02-14 DIAGNOSIS — L292 Pruritus vulvae: Secondary | ICD-10-CM | POA: Diagnosis not present

## 2019-03-15 DIAGNOSIS — J069 Acute upper respiratory infection, unspecified: Secondary | ICD-10-CM | POA: Diagnosis not present

## 2019-03-23 DIAGNOSIS — R05 Cough: Secondary | ICD-10-CM | POA: Diagnosis not present

## 2019-03-23 DIAGNOSIS — J309 Allergic rhinitis, unspecified: Secondary | ICD-10-CM | POA: Diagnosis not present

## 2019-03-31 DIAGNOSIS — N943 Premenstrual tension syndrome: Secondary | ICD-10-CM | POA: Diagnosis not present

## 2019-03-31 DIAGNOSIS — U071 COVID-19: Secondary | ICD-10-CM | POA: Diagnosis not present

## 2019-03-31 DIAGNOSIS — E282 Polycystic ovarian syndrome: Secondary | ICD-10-CM | POA: Diagnosis not present

## 2019-03-31 DIAGNOSIS — N926 Irregular menstruation, unspecified: Secondary | ICD-10-CM | POA: Diagnosis not present

## 2019-04-06 DIAGNOSIS — L292 Pruritus vulvae: Secondary | ICD-10-CM | POA: Diagnosis not present

## 2019-04-06 DIAGNOSIS — K644 Residual hemorrhoidal skin tags: Secondary | ICD-10-CM | POA: Diagnosis not present

## 2019-04-06 DIAGNOSIS — N926 Irregular menstruation, unspecified: Secondary | ICD-10-CM | POA: Diagnosis not present

## 2019-04-07 DIAGNOSIS — G4700 Insomnia, unspecified: Secondary | ICD-10-CM | POA: Diagnosis not present

## 2019-04-07 DIAGNOSIS — N926 Irregular menstruation, unspecified: Secondary | ICD-10-CM | POA: Diagnosis not present

## 2019-04-07 DIAGNOSIS — U071 COVID-19: Secondary | ICD-10-CM | POA: Diagnosis not present

## 2019-04-07 DIAGNOSIS — N943 Premenstrual tension syndrome: Secondary | ICD-10-CM | POA: Diagnosis not present

## 2019-04-28 DIAGNOSIS — F411 Generalized anxiety disorder: Secondary | ICD-10-CM | POA: Diagnosis not present

## 2019-04-28 DIAGNOSIS — U071 COVID-19: Secondary | ICD-10-CM | POA: Diagnosis not present

## 2019-04-28 DIAGNOSIS — F331 Major depressive disorder, recurrent, moderate: Secondary | ICD-10-CM | POA: Diagnosis not present

## 2019-04-28 DIAGNOSIS — G4700 Insomnia, unspecified: Secondary | ICD-10-CM | POA: Diagnosis not present

## 2019-05-23 DIAGNOSIS — G4700 Insomnia, unspecified: Secondary | ICD-10-CM | POA: Diagnosis not present

## 2019-05-23 DIAGNOSIS — U071 COVID-19: Secondary | ICD-10-CM | POA: Diagnosis not present

## 2019-05-23 DIAGNOSIS — F411 Generalized anxiety disorder: Secondary | ICD-10-CM | POA: Diagnosis not present

## 2019-05-23 DIAGNOSIS — J309 Allergic rhinitis, unspecified: Secondary | ICD-10-CM | POA: Diagnosis not present

## 2019-06-10 DIAGNOSIS — G4700 Insomnia, unspecified: Secondary | ICD-10-CM | POA: Diagnosis not present

## 2019-06-10 DIAGNOSIS — F331 Major depressive disorder, recurrent, moderate: Secondary | ICD-10-CM | POA: Diagnosis not present

## 2019-06-10 DIAGNOSIS — F411 Generalized anxiety disorder: Secondary | ICD-10-CM | POA: Diagnosis not present

## 2019-07-11 DIAGNOSIS — F411 Generalized anxiety disorder: Secondary | ICD-10-CM | POA: Diagnosis not present

## 2019-07-11 DIAGNOSIS — G4700 Insomnia, unspecified: Secondary | ICD-10-CM | POA: Diagnosis not present

## 2019-07-11 DIAGNOSIS — U071 COVID-19: Secondary | ICD-10-CM | POA: Diagnosis not present

## 2019-07-11 DIAGNOSIS — J029 Acute pharyngitis, unspecified: Secondary | ICD-10-CM | POA: Diagnosis not present

## 2019-07-12 ENCOUNTER — Other Ambulatory Visit: Payer: Self-pay

## 2019-07-12 DIAGNOSIS — Z20822 Contact with and (suspected) exposure to covid-19: Secondary | ICD-10-CM

## 2019-07-12 DIAGNOSIS — J029 Acute pharyngitis, unspecified: Secondary | ICD-10-CM | POA: Diagnosis not present

## 2019-07-12 DIAGNOSIS — G4700 Insomnia, unspecified: Secondary | ICD-10-CM | POA: Diagnosis not present

## 2019-07-12 DIAGNOSIS — R5383 Other fatigue: Secondary | ICD-10-CM | POA: Diagnosis not present

## 2019-07-12 DIAGNOSIS — R6889 Other general symptoms and signs: Secondary | ICD-10-CM | POA: Diagnosis not present

## 2019-07-12 DIAGNOSIS — N926 Irregular menstruation, unspecified: Secondary | ICD-10-CM | POA: Diagnosis not present

## 2019-07-13 IMAGING — CT CT RENAL STONE PROTOCOL
2 of 4 series · 16 of 46 positions shown, 18 images · non-contrast
Comparison: No focal liver lesions are evident on this noncontrast
enhanced study.

CLINICAL DATA: Flank pain there is bibasilar atelectatic change. No
edema or consolidation.

EXAM:
CT ABDOMEN AND PELVIS WITHOUT CONTRAST
TECHNIQUE: Multidetector CT imaging of the abdomen and pelvis was performed
following the standard protocol without IV contrast.

[Series 2: axial st · axial · 0.71mm/px · z∈[-418,-3]mm · 13 of 95 slices shown, 15 images]
[im 6/95  soft-tissue]
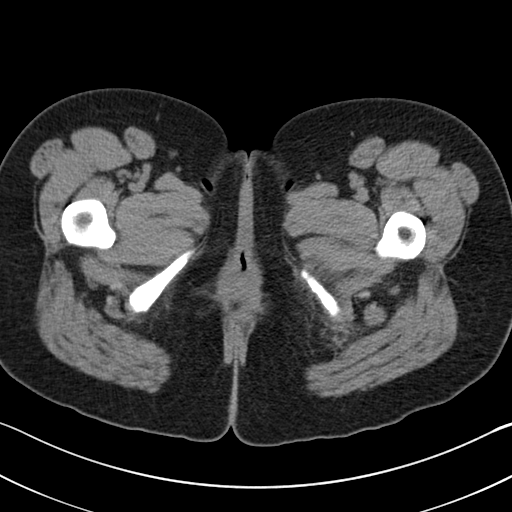
[im 6/95  bone]
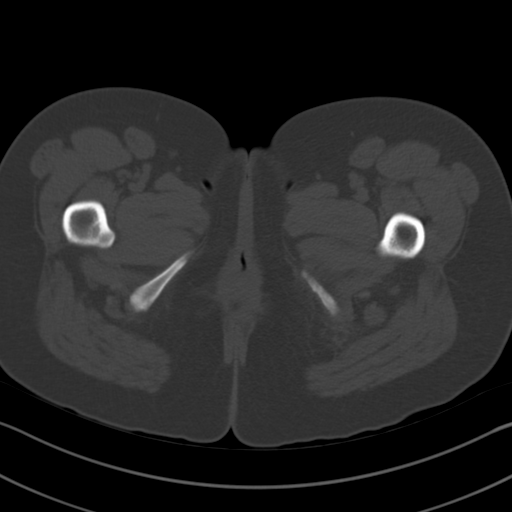
[im 12/95  soft-tissue]
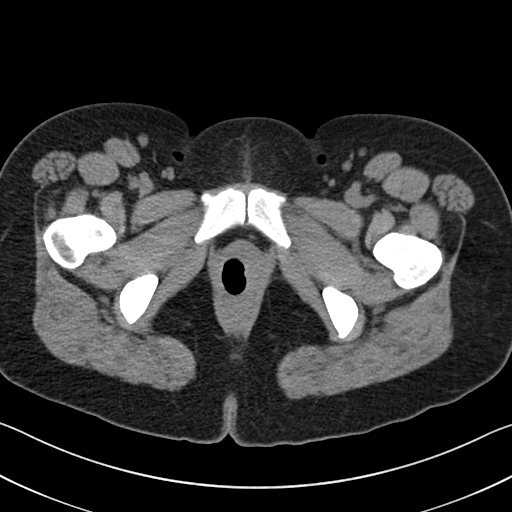
[im 18/95  soft-tissue]
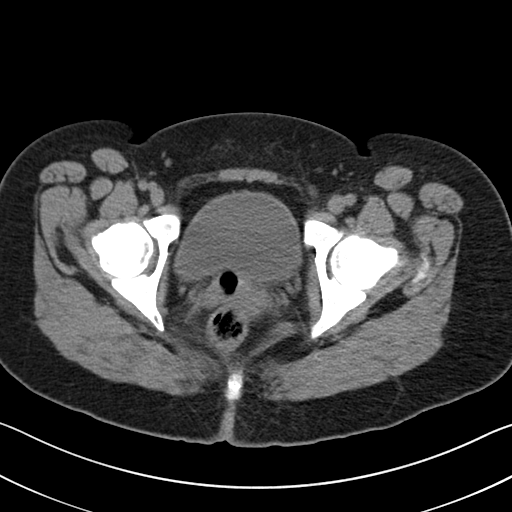
[im 30/95  soft-tissue]
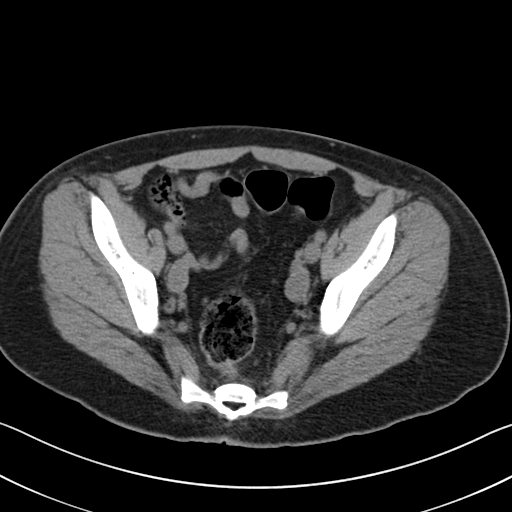
[im 36/95  soft-tissue]
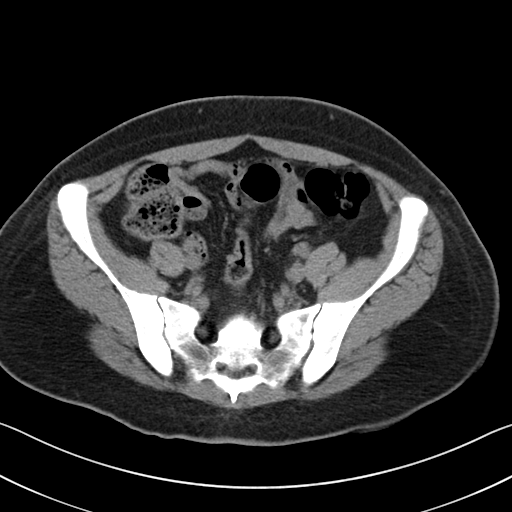
[im 42/95  soft-tissue]
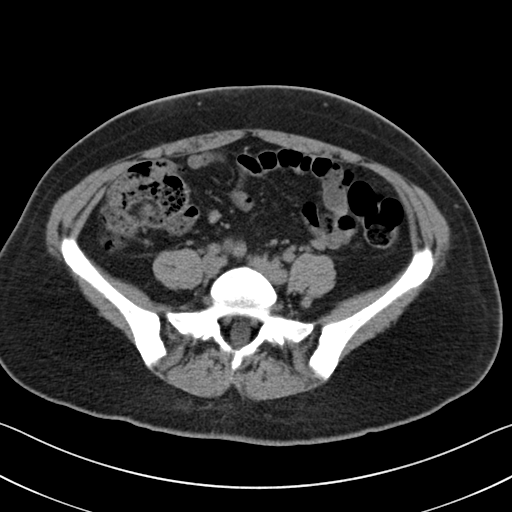
[im 48/95  soft-tissue]
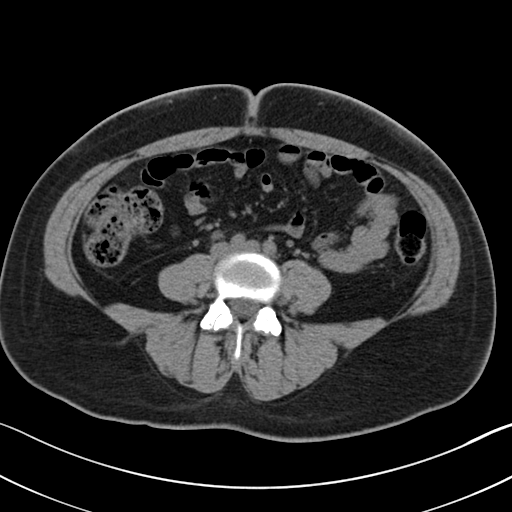
[im 53/95  soft-tissue]
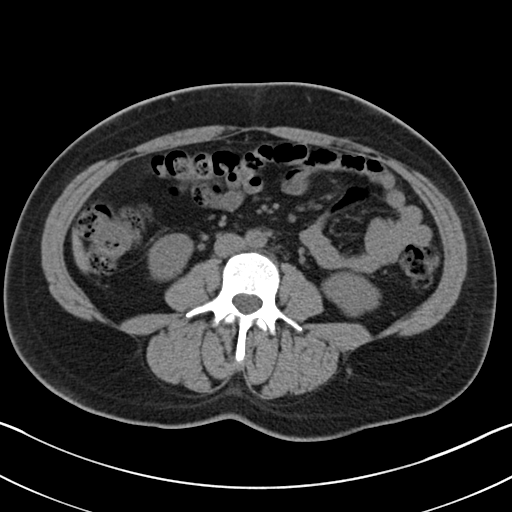
[im 59/95  soft-tissue]
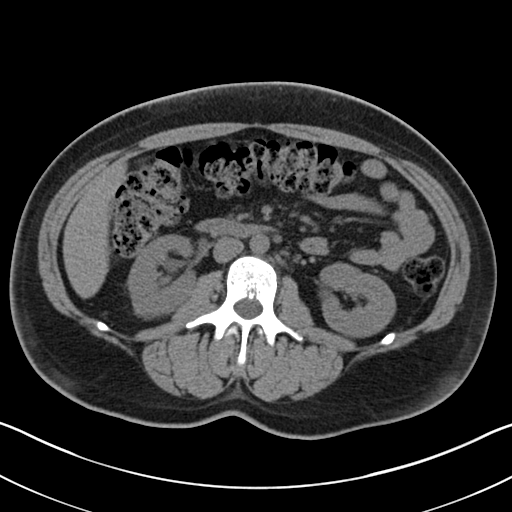
[im 59/95  bone]
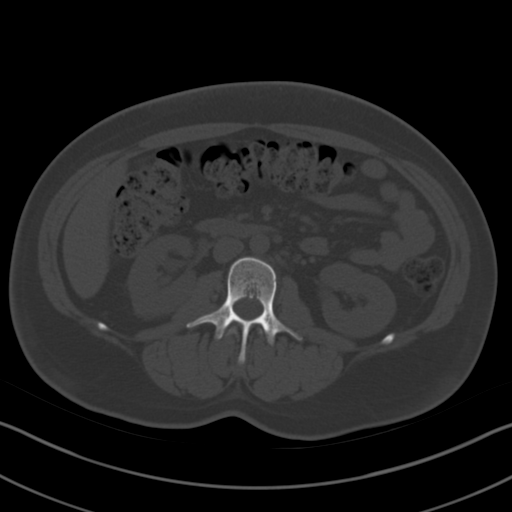
[im 65/95  soft-tissue]
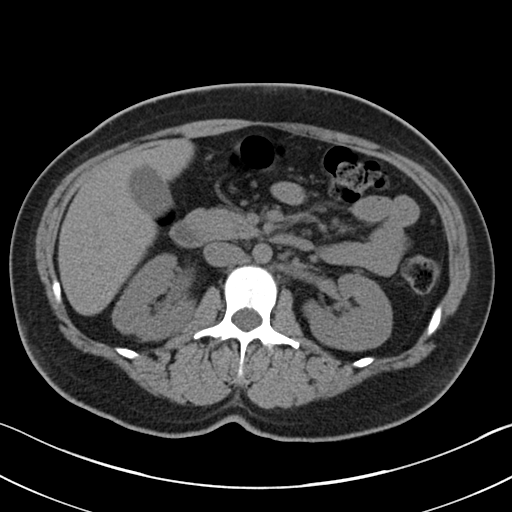
[im 77/95  soft-tissue]
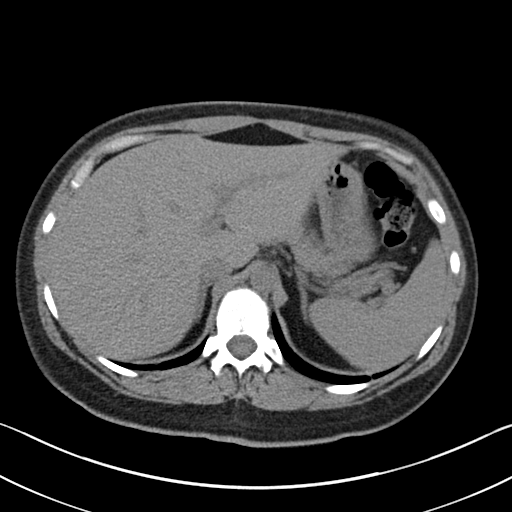
[im 83/95  soft-tissue]
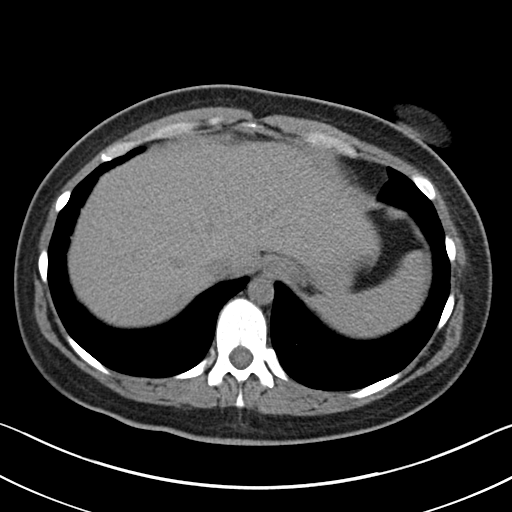
[im 89/95  soft-tissue]
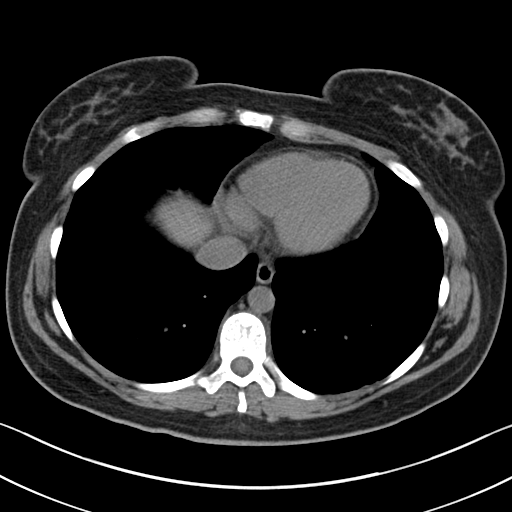

[Series 5: coronal · coronal · 0.79mm/px · 3 of 151 slices shown]
[im 51/151  soft-tissue]
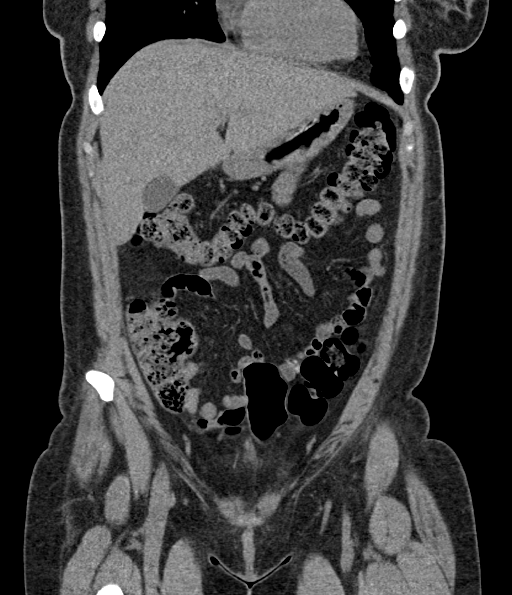
[im 67/151  soft-tissue]
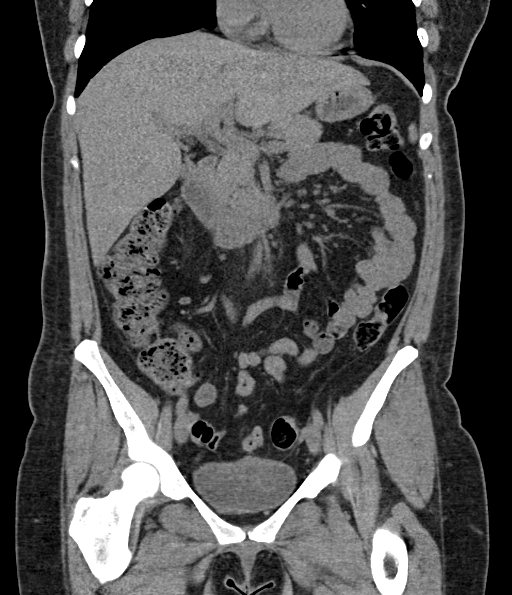
[im 84/151  soft-tissue]
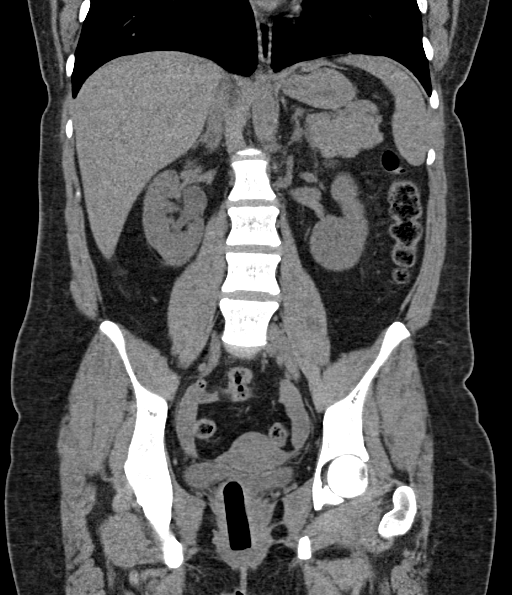

[16 of 46 positions shown; findings below may reference images not displayed]

Gallbladder wall is not appreciably thickened. There
is no biliary duct dilatation.
FINDINGS: Lower chest: Lung bases are clear.

Hepatobiliary: No focal liver lesions are appreciable. Gallbladder
wall is not thickened. No biliary duct dilatation.

Pancreas: No pancreatic mass or inflammatory focus.

Spleen: No splenic lesions evident.

Adrenals/Urinary Tract: Adrenals appear normal bilaterally. There is
no renal mass on either side. There is a junctional parenchymal
defect on each side, an anatomic variant. There is moderate
hydronephrosis on the right. There is no hydronephrosis on the left.
There is a 2 mm calculus in the upper pole of the right kidney.
There is a 2 mm calculus in the lower pole of the right kidney.
There is a 1 mm calculus in the upper pole left kidney. There is a 3
mm calculus just proximal to the right ureterovesical junction. No
other ureteral calculi are evident. Urinary bladder is midline with
wall thickness within normal limits.

Stomach/Bowel: There is no appreciable bowel wall or mesenteric
thickening. No evident bowel obstruction. No free air or portal
venous air.

Vascular/Lymphatic: There is no abdominal aortic aneurysm. No
vascular lesions are evident on this noncontrast enhanced studies.
There is a retroaortic left renal vein, an anatomic variant. There
is no adenopathy in the abdomen or pelvis.

Reproductive: Uterus is anteverted. Tampon in place. No pelvic mass
evident.

Other: Appendix appears unremarkable. There is no abscess or ascites
in the abdomen or pelvis.

Musculoskeletal: There are no blastic or lytic bone lesions. There
is a tiny bone island in the right femoral head. No intramuscular or
abdominal wall lesions.
IMPRESSION: 1. 3 mm calculus just proximal to the right ureterovesical junction
causing moderate hydronephrosis on the right.

2.  Small nonobstructing calculi in each kidney.

3. No evident bowel obstruction. No abscess in the abdomen pelvis.
Appendix appears normal.

## 2019-07-14 LAB — NOVEL CORONAVIRUS, NAA: SARS-CoV-2, NAA: NOT DETECTED

## 2019-07-20 DIAGNOSIS — G4700 Insomnia, unspecified: Secondary | ICD-10-CM | POA: Diagnosis not present

## 2019-07-20 DIAGNOSIS — F411 Generalized anxiety disorder: Secondary | ICD-10-CM | POA: Diagnosis not present

## 2019-07-20 DIAGNOSIS — J029 Acute pharyngitis, unspecified: Secondary | ICD-10-CM | POA: Diagnosis not present

## 2019-07-20 DIAGNOSIS — E559 Vitamin D deficiency, unspecified: Secondary | ICD-10-CM | POA: Diagnosis not present

## 2019-07-29 ENCOUNTER — Telehealth (INDEPENDENT_AMBULATORY_CARE_PROVIDER_SITE_OTHER): Payer: BC Managed Care – PPO | Admitting: Neurology

## 2019-07-29 ENCOUNTER — Other Ambulatory Visit: Payer: Self-pay

## 2019-07-29 ENCOUNTER — Encounter: Payer: Self-pay | Admitting: Neurology

## 2019-07-29 VITALS — Ht 65.0 in | Wt 160.0 lb

## 2019-07-29 DIAGNOSIS — B948 Sequelae of other specified infectious and parasitic diseases: Secondary | ICD-10-CM

## 2019-07-29 DIAGNOSIS — R4189 Other symptoms and signs involving cognitive functions and awareness: Secondary | ICD-10-CM

## 2019-07-29 DIAGNOSIS — R413 Other amnesia: Secondary | ICD-10-CM

## 2019-07-29 NOTE — Progress Notes (Signed)
Virtual Visit via Video Note The purpose of this virtual visit is to provide medical care while limiting exposure to the novel coronavirus.    Consent was obtained for video visit:  Yes.   Answered questions that patient had about telehealth interaction:  Yes.   I discussed the limitations, risks, security and privacy concerns of performing an evaluation and management service by telemedicine. I also discussed with the patient that there may be a patient responsible charge related to this service. The patient expressed understanding and agreed to proceed.  Pt location: Home Physician Location: office Name of referring provider:  Fanny Bien, MD I connected with Gail Price at patients initiation/request on 07/29/2019 at 10:30 AM EDT by video enabled telemedicine application and verified that I am speaking with the correct person using two identifiers. Pt MRN:  [id number] Pt DOB:  [birth year] Video Participants:  Gail Price   History of Present Illness:  This is a pleasant 33 year old right-handed woman with no significant past medical history presenting for evaluation of memory loss. She was diagnosed with Covid-19 last March when she had cough, shortness of breath. Her Covid-19 test was negative, however she had clinical symptoms. She got over her cough in April, but continued to have shortness of breath, as well as cognitive and personality changes.She had significant fatigue, memory and attention difficulties. She has noticed her audio comprehension has worsened. 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 She would leave words out of sentences, forget how to spell, skip lines when reading, forgetting words when she speaks or types. On really bad days, her short term memory is gone. She started having insomnia with 2-6 hours of sleep. She was started on  amitriptyline 2 weeks ago, she can sometimes sleep 12 hours, but would still feel very tired, taking 4 hour naps in the day. She sometimes feels like she has ADD, someone would talk to her and she does not notice them. She used to be a very energetic and motivated person, working on the [employer]. She now has a general disinterest in everything, nothing she used to do is fun anymore. Her appetite is low. She was started on sertraline in June and states she does not feel morose. She took sertraline briefly in college. She takes Xanax once a week if she cannot sleep. She lives with her mother and brother and has not driven since she is so tired. She denies any missed bills or medications.   Over the past month, she has had mild headaches with pressure in the frontal region lasting several house. They occur 1-2 times a week, no associated nausea/vomiting, vision changes, photo/phonophobia. She has dizziness when standing. She has noticed her heart rate is usually elevated 90-100 bpm, and walking across the house increases it to 113. She still feels short of breath even if sitting, worse with walking. Her O2 sats have been good. No diplopia, dysarthria/dysphagia, focal numbness/tingling/weakness, tremors. She has neck and back pain, and alternating diarrhea and constipation. She did not have any loss of sense of smell or taste with Covid infection. No prior history of head injuries, no family history of memory loss. She very rarely drinks alcohol.   PAST MEDICAL HISTORY: Past Medical History:  Diagnosis Date   Anxiety    COVID-19    Depression  PAST SURGICAL HISTORY: Past Surgical History:  Procedure Laterality Date   eye surgery Right    reconstruction    MEDICATIONS: Current Outpatient Medications on File Prior to Visit  Medication Sig Dispense Refill   albuterol (VENTOLIN HFA) 108 (90 Base) MCG/ACT inhaler Inhale 1 puff into the lungs as needed.     ALPRAZolam (XANAX)  0.25 MG tablet Take 0.25 mg by mouth as needed.     amitriptyline (ELAVIL) 25 MG tablet Take 25 mg by mouth at bedtime.     Multiple Vitamin (MULTIVITAMIN WITH MINERALS) TABS tablet Take 1 tablet by mouth daily.     NUVARING 0.12-0.015 MG/24HR vaginal ring Place 1 applicator vaginally every 30 (thirty) days.     sertraline (ZOLOFT) 50 MG tablet Take 50 mg by mouth daily.     No current facility-administered medications on file prior to visit.     ALLERGIES: No Known Allergies  FAMILY HISTORY: Family History  Problem Relation Age of Onset   Colon cancer Paternal Grandmother [id number]    Observations/Objective:   Vitals:   07/29/19 1009  Weight: 160 lb (72.6 kg)  Height: 5\' 5"  (1.651 m)   GEN:  The patient appears stated age and is in NAD, tired-appearing  Neurological examination: Patient is awake, alert, oriented x 3. No aphasia or dysarthria. Intact fluency and comprehension. Remote and recent memory intact. Able to name and repeat. Cranial nerves: Extraocular movements intact with no nystagmus. No facial asymmetry. Motor: moves all extremities symmetrically, at least anti-gravity x 4. No incoordination on finger to nose testing. Gait: narrow-based and steady, able to tandem walk adequately. Negative Romberg test. Montreal Cognitive Assessment  07/29/2019  Visuospatial/ Executive (0/5) 4  Naming (0/3) 3  Attention: Read list of digits (0/2) 2  Attention: Read list of letters (0/1) 1  Attention: Serial 7 subtraction starting at 100 (0/3) 3  Language: Repeat phrase (0/2) 2  Language : Fluency (0/1) 1  Abstraction (0/2) 2  Delayed Recall (0/5) 5  Orientation (0/6) 6  Total 29    Assessment and Plan:   This is a pleasant 33 year old right-handed woman presenting with cognitive and personality changes since Covid-19 infection last March 2020. She describes insomnia, memory loss, fatigue, as well as depressive symptoms that have been described to occur post-viral infection with  Covid-19. Her neurological exam is normal, MOCA score normal 29/30. MRI brain with and without contrast will be ordered to assess for underlying structural abnormality. Speech therapy will be ordered to help with cognitive rehab. We discussed symptomatic treatment of depression, increase sertraline as tolerated. She will discuss continued shortness of breath and tachycardia with PCP. Follow-up in 3-4 months, she knows to call for any changes.    Follow Up Instructions:   -I discussed the assessment and treatment plan with the patient. The patient was provided an opportunity to ask questions and all were answered. The patient agreed with the plan and demonstrated an understanding of the instructions.   The patient was advised to call back or seek an in-person evaluation if the symptoms worsen or if the condition fails to improve as anticipated.    Van ClinesKaren M Kaylon Laroche, MD

## 2019-08-05 ENCOUNTER — Encounter (HOSPITAL_COMMUNITY): Payer: Self-pay

## 2019-08-05 ENCOUNTER — Other Ambulatory Visit: Payer: Self-pay

## 2019-08-05 ENCOUNTER — Emergency Department (HOSPITAL_COMMUNITY): Payer: BC Managed Care – PPO

## 2019-08-05 DIAGNOSIS — R0789 Other chest pain: Secondary | ICD-10-CM | POA: Diagnosis not present

## 2019-08-05 DIAGNOSIS — R000 Tachycardia, unspecified: Secondary | ICD-10-CM | POA: Diagnosis not present

## 2019-08-05 DIAGNOSIS — F411 Generalized anxiety disorder: Secondary | ICD-10-CM | POA: Diagnosis not present

## 2019-08-05 DIAGNOSIS — R079 Chest pain, unspecified: Secondary | ICD-10-CM | POA: Diagnosis not present

## 2019-08-05 DIAGNOSIS — R072 Precordial pain: Secondary | ICD-10-CM | POA: Diagnosis not present

## 2019-08-05 DIAGNOSIS — U071 COVID-19: Secondary | ICD-10-CM | POA: Diagnosis not present

## 2019-08-05 DIAGNOSIS — G4700 Insomnia, unspecified: Secondary | ICD-10-CM | POA: Diagnosis not present

## 2019-08-05 LAB — BASIC METABOLIC PANEL
Anion gap: 11 (ref 5–15)
BUN: 11 mg/dL (ref 6–20)
CO2: 21 mmol/L — ABNORMAL LOW (ref 22–32)
Calcium: 9.6 mg/dL (ref 8.9–10.3)
Chloride: 105 mmol/L (ref 98–111)
Creatinine, Ser: 0.72 mg/dL (ref 0.44–1.00)
GFR calc Af Amer: >60 mL/min (ref >60)
GFR calc non Af Amer: >60 mL/min (ref >60)
Glucose, Bld: 116 mg/dL — ABNORMAL HIGH (ref 70–99)
Potassium: 3.9 mmol/L (ref 3.5–5.1)
Sodium: 137 mmol/L (ref 135–145)

## 2019-08-05 LAB — CBC
HCT: 39.9 % (ref 36.0–46.0)
Hemoglobin: 13.6 g/dL (ref 12.0–15.0)
MCH: 29.1 pg (ref 26.0–34.0)
MCHC: 34.1 g/dL (ref 30.0–36.0)
MCV: 85.4 fL (ref 80.0–100.0)
Platelets: 296 10*3/uL (ref 150–400)
RBC: 4.67 MIL/uL (ref 3.87–5.11)
RDW: 12.4 % (ref 11.5–15.5)
WBC: 9.3 10*3/uL (ref 4.0–10.5)
nRBC: 0 % (ref 0.0–0.2)

## 2019-08-05 LAB — TROPONIN I (HIGH SENSITIVITY)
Troponin I (High Sensitivity): <2 ng/L (ref <18)
Troponin I (High Sensitivity): <2 ng/L (ref <18)

## 2019-08-05 LAB — URINALYSIS, ROUTINE W REFLEX MICROSCOPIC
Bilirubin Urine: NEGATIVE
Glucose, UA: NEGATIVE mg/dL
Hgb urine dipstick: NEGATIVE
Ketones, ur: 20 mg/dL — AB
Leukocytes,Ua: NEGATIVE
Nitrite: NEGATIVE
Protein, ur: NEGATIVE mg/dL
Specific Gravity, Urine: 1.016 (ref 1.005–1.030)
pH: 5 (ref 5.0–8.0)

## 2019-08-05 LAB — I-STAT BETA HCG BLOOD, ED (NOT ORDERABLE): I-stat hCG, quantitative: <5 m[IU]/mL (ref <5)

## 2019-08-05 MED ORDER — SODIUM CHLORIDE 0.9% FLUSH
3.0000 mL | Freq: Once | INTRAVENOUS | Status: AC
  Administered 2019-08-06: 3 mL via INTRAVENOUS

## 2019-08-05 NOTE — ED Triage Notes (Signed)
Patient dropped off by daughter.   C/O heart palpitations and trouble talking that started yesterday afternoon.   Patient states "I feel like I took an adderal but didn't.   Mid chest pain 7/10 pain.   Patient states she was diagnosed with covid in march.   A/ox4 Ambulatory in triage.

## 2019-08-05 NOTE — ED Notes (Signed)
Pt now c/o right lower back pain "at my kidneys". U/A ordered

## 2019-08-06 ENCOUNTER — Emergency Department (HOSPITAL_COMMUNITY)
Admission: EM | Admit: 2019-08-06 | Discharge: 2019-08-06 | Disposition: A | Discharge status: Home / Self Care | Payer: BC Managed Care – PPO | Attending: Emergency Medicine | Admitting: Emergency Medicine

## 2019-08-06 DIAGNOSIS — R0789 Other chest pain: Secondary | ICD-10-CM

## 2019-08-06 MED ORDER — LIDOCAINE VISCOUS HCL 2 % MT SOLN
15.0000 mL | Freq: Once | OROMUCOSAL | Status: AC
  Administered 2019-08-06: 01:00:00 15 mL via ORAL
  Filled 2019-08-06: qty 15

## 2019-08-06 MED ORDER — ALUM & MAG HYDROXIDE-SIMETH 200-200-20 MG/5ML PO SUSP
30.0000 mL | Freq: Once | ORAL | Status: AC
  Administered 2019-08-06: 30 mL via ORAL
  Filled 2019-08-06: qty 30

## 2019-08-06 NOTE — ED Provider Notes (Signed)
[facility]-EMERGENCY DEPT Provider Note  CSN: [id number] Arrival date & time: 08/05/19 1612  Chief Complaint(s) Chest Pain and Tachycardia  HPI Gail Price is a 33 y.o. female   The history is provided by the patient.  Chest Pain Pain location:  Substernal area Pain quality: pressure   Pain radiates to:  Epigastrium and R shoulder Pain severity:  Moderate Onset quality:  Gradual Duration:  10 hours Timing:  Intermittent Progression:  Waxing and waning Chronicity:  New Relieved by:  Nothing Worsened by:  Nothing Associated symptoms: anxiety, fatigue and shortness of breath   Associated symptoms: no cough, no fever, no heartburn, no nausea, no vomiting and no weakness   Risk factors: no coronary artery disease, no diabetes mellitus, no high cholesterol, no hypertension, no prior DVT/PE and no smoking    Symptoms began after eating granola and yogurt.  Patient felt "like I was ending" earlier and felt that "I needed to get to the hospital."  Past Medical History Past Medical History:  Diagnosis Date   Anxiety    COVID-19    Depression    There are no active problems to display for this patient.  Home Medication(s) Prior to Admission medications   Medication Sig Start Date End Date Taking? Authorizing Provider  albuterol (VENTOLIN HFA) 108 (90 Base) MCG/ACT inhaler Inhale 2 puffs into the lungs every 4 (four) hours as needed for wheezing or shortness of breath.  03/15/19  Yes [provider]  ALPRAZolam (XANAX) 0.25 MG tablet Take 0.25 mg by mouth 3 (three) times daily as needed for anxiety or sleep.  06/10/19  Yes [provider]  amitriptyline (ELAVIL) 25 MG tablet Take 25 mg by mouth at bedtime as needed for sleep.  07/11/19  Yes [provider]  Cholecalciferol (VITAMIN D) 50 MCG (2000 UT) tablet Take 2,000 Units by mouth daily.   Yes [provider]  Multiple Vitamin (MULTIVITAMIN WITH MINERALS) TABS tablet  Take 1 tablet by mouth daily.   Yes [provider]  NUVARING 0.12-0.015 MG/24HR vaginal ring Place 1 applicator vaginally every 30 (thirty) days. 05/15/19  Yes [provider]  sertraline (ZOLOFT) 50 MG tablet Take 50 mg by mouth daily. 05/23/19  Yes [provider]                                                                                                                                    Past Surgical History Past Surgical History:  Procedure Laterality Date   eye surgery Right    reconstruction   Family History Family History  Problem Relation Age of Onset   Colon cancer Paternal Grandmother 19    Social History Social History   Tobacco Use   Smoking status: Never Smoker   Smokeless tobacco: Never Used  Substance Use Topics   Alcohol use: Not Currently   Drug use: Not Currently   Allergies Patient has  no known allergies.  Review of Systems Review of Systems  Constitutional: Positive for fatigue. Negative for fever.  Respiratory: Positive for shortness of breath. Negative for cough.   Cardiovascular: Positive for chest pain.  Gastrointestinal: Negative for heartburn, nausea and vomiting.  Neurological: Negative for weakness.   All other systems are reviewed and are negative for acute change except as noted in the HPI  Physical Exam Vital Signs  I have reviewed the triage vital signs BP 118/84    Pulse 91    Temp 98.2 F (36.8 C) (Oral)    Resp (!) 28    Ht 5\' 5"  (1.651 m)    Wt 72.6 kg    LMP 07/28/2019    SpO2 100%    BMI 26.63 kg/m   Physical Exam Vitals signs reviewed.  Constitutional:      General: She is not in acute distress.    Appearance: She is well-developed. She is not diaphoretic.  HENT:     Head: Normocephalic and atraumatic.     Nose: Nose normal.  Eyes:     General: No scleral icterus.       Right eye: No discharge.        Left eye: No discharge.     Conjunctiva/sclera: Conjunctivae normal.     Pupils:  Pupils are equal, round, and reactive to light.  Neck:     Musculoskeletal: Normal range of motion and neck supple.  Cardiovascular:     Rate and Rhythm: Normal rate and regular rhythm.     Heart sounds: No murmur. No friction rub. No gallop.   Pulmonary:     Effort: Pulmonary effort is normal. No respiratory distress.     Breath sounds: Normal breath sounds. No stridor. No rales.  Abdominal:     General: There is no distension.     Palpations: Abdomen is soft.     Tenderness: There is no abdominal tenderness.  Musculoskeletal:        General: No tenderness.  Skin:    General: Skin is warm and dry.     Findings: No erythema or rash.  Neurological:     Mental Status: She is alert and oriented to person, place, and time.     ED Results and Treatments Labs (all labs ordered are listed, but only abnormal results are displayed) Labs Reviewed  BASIC METABOLIC PANEL - Abnormal; Notable for the following components:      Result Value   CO2 21 (*)    Glucose, Bld 116 (*)    All other components within normal limits  URINALYSIS, ROUTINE W REFLEX MICROSCOPIC - Abnormal; Notable for the following components:   Ketones, ur 20 (*)    All other components within normal limits  CBC  I-STAT BETA HCG BLOOD, ED ([facility], [facility], [facility] ONLY)  I-STAT BETA HCG BLOOD, ED (NOT ORDERABLE)  TROPONIN I (HIGH SENSITIVITY)  TROPONIN I (HIGH SENSITIVITY)  EKG  EKG Interpretation  Date/Time:  Friday August 05 2019 16:30:46 EDT Ventricular Rate:  110 PR Interval:    QRS Duration: 95 QT Interval:  363 QTC Calculation: 492 R Axis:   74 Text Interpretation:  Sinus tachycardia Borderline T abnormalities, diffuse leads Borderline prolonged QT interval NO STEMI. No old tracing to compare Confirmed by Addison Lank [phone]) on 08/06/2019 12:49:54 AM      Radiology Dg Chest 2 View  Result  Date: 08/05/2019 CLINICAL DATA:  Acute chest pain for 1 day. EXAM: CHEST - 2 VIEW COMPARISON:  None. FINDINGS: The cardiomediastinal silhouette is unremarkable. There is no evidence of focal airspace disease, pulmonary edema, suspicious pulmonary nodule/mass, pleural effusion, or pneumothorax. No acute bony abnormalities are identified. IMPRESSION: No active cardiopulmonary disease. Electronically Signed   By: Margarette Canada M.D.   On: 08/05/2019 17:21    Pertinent labs & imaging results that were available during my care of the patient were reviewed by me and considered in my medical decision making (see chart for details).  Medications Ordered in ED Medications  sodium chloride flush (NS) 0.9 % injection 3 mL (3 mLs Intravenous Given 08/06/19 0113)  alum & mag hydroxide-simeth (MAALOX/MYLANTA) 200-200-20 MG/5ML suspension 30 mL (30 mLs Oral Given 08/06/19 0112)    And  lidocaine (XYLOCAINE) 2 % viscous mouth solution 15 mL (15 mLs Oral Given 08/06/19 0112)                                                                                                                                    Procedures Procedures  (including critical care time)  Medical Decision Making / ED Course I have reviewed the nursing notes for this encounter and the patient's prior records (if available in EHR or on provided paperwork).   Gail Price was evaluated in Emergency Department on 08/06/2019 for the symptoms described in the history of present illness. She was evaluated in the context of the global COVID-19 pandemic, which necessitated consideration that the patient might be at risk for infection with the SARS-CoV-2 virus that causes COVID-19. Institutional protocols and algorithms that pertain to the evaluation of patients at risk for COVID-19 are in a state of rapid change based on information released by regulatory bodies including the CDC and federal and state organizations. These policies and algorithms were  followed during the patient's care in the ED.  Atypical chest pain.  EKG without acute ischemic changes or evidence of pericarditis.  Serial troponins negative.  Presentation not classic for aortic dissection or esophageal perforation.  Low suspicion for pulmonary embolism.  Chest x-ray without evidence suggestive of pneumonia, pneumothorax, pneumomediastinum.  No abnormal contour of the mediastinum to suggest dissection. No evidence of acute injuries.  Suspect possible GI etiology.  Provided with GI cocktail resulting in complete resolution of her symptoms.  The patient appears reasonably screened and/or stabilized for discharge and I doubt any other medical  condition or other [facility] requiring further screening, evaluation, or treatment in the ED at this time prior to discharge.  The patient is safe for discharge with strict return precautions.       Final Clinical Impression(s) / ED Diagnoses Final diagnoses:  Atypical chest pain     The patient appears reasonably screened and/or stabilized for discharge and I doubt any other medical condition or other [facility] requiring further screening, evaluation, or treatment in the ED at this time prior to discharge.  Disposition: Discharge  Condition: Good  I have discussed the results, Dx and Tx plan with the patient who expressed understanding and agree(s) with the plan. Discharge instructions discussed at great length. The patient was given strict return precautions who verbalized understanding of the instructions. No further questions at time of discharge.    ED Discharge Orders    None       Follow Up: No follow-up provider specified.   This chart was dictated using voice recognition software.  Despite best efforts to proofread,  errors can occur which can change the documentation meaning.   Nira Connardama, Bolton Canupp Eduardo, MD 08/06/19 [phone]

## 2019-08-11 DIAGNOSIS — G4700 Insomnia, unspecified: Secondary | ICD-10-CM | POA: Diagnosis not present

## 2019-08-11 DIAGNOSIS — F411 Generalized anxiety disorder: Secondary | ICD-10-CM | POA: Diagnosis not present

## 2019-08-11 DIAGNOSIS — U071 COVID-19: Secondary | ICD-10-CM | POA: Diagnosis not present

## 2019-08-15 ENCOUNTER — Telehealth: Payer: Self-pay | Admitting: Neurology

## 2019-08-15 NOTE — Telephone Encounter (Signed)
Patient is calling in about MRI and not hearing from any one to schedule this. Thanks!

## 2019-08-16 ENCOUNTER — Other Ambulatory Visit: Payer: Self-pay | Admitting: *Deleted

## 2019-08-16 DIAGNOSIS — R413 Other amnesia: Secondary | ICD-10-CM

## 2019-08-16 DIAGNOSIS — R4189 Other symptoms and signs involving cognitive functions and awareness: Secondary | ICD-10-CM

## 2019-08-16 NOTE — Telephone Encounter (Signed)
Not sure what happened, pls order MRI brain with and without contrast, also pls order Speech Therapy. Thanks!

## 2019-08-16 NOTE — Telephone Encounter (Signed)
Notified pt oredered MRI brain w/wo contrast and speech therapy.

## 2019-09-03 ENCOUNTER — Ambulatory Visit
Admission: RE | Admit: 2019-09-03 | Discharge: 2019-09-03 | Disposition: A | Discharge status: Home / Self Care | Payer: BC Managed Care – PPO | Source: Ambulatory Visit | Attending: Neurology | Admitting: Neurology

## 2019-09-03 ENCOUNTER — Other Ambulatory Visit: Payer: Self-pay

## 2019-09-03 DIAGNOSIS — R4689 Other symptoms and signs involving appearance and behavior: Secondary | ICD-10-CM

## 2019-09-03 DIAGNOSIS — R4189 Other symptoms and signs involving cognitive functions and awareness: Secondary | ICD-10-CM

## 2019-09-03 DIAGNOSIS — R413 Other amnesia: Secondary | ICD-10-CM | POA: Diagnosis not present

## 2019-09-03 DIAGNOSIS — U071 COVID-19: Secondary | ICD-10-CM | POA: Diagnosis not present

## 2019-09-03 DIAGNOSIS — R410 Disorientation, unspecified: Secondary | ICD-10-CM | POA: Diagnosis not present

## 2019-09-03 MED ORDER — GADOBENATE DIMEGLUMINE 529 MG/ML IV SOLN
15.0000 mL | Freq: Once | INTRAVENOUS | Status: AC | PRN
  Administered 2019-09-03: 10:00:00 15 mL via INTRAVENOUS

## 2019-09-06 ENCOUNTER — Telehealth: Payer: Self-pay

## 2019-09-06 NOTE — Telephone Encounter (Signed)
Pt informed of MRI results.

## 2019-09-06 NOTE — Telephone Encounter (Signed)
-----   Message from Cameron Sprang, MD sent at 09/05/2019  2:47 PM EDT ----- Pls let her know the MRI brain is normal, no evidence of tumor, stroke, bleed, inflammation, or permanent injury. Thanks

## 2019-09-13 ENCOUNTER — Ambulatory Visit: Payer: BC Managed Care – PPO | Admitting: Critical Care Medicine

## 2019-09-13 ENCOUNTER — Other Ambulatory Visit: Payer: Self-pay

## 2019-09-13 ENCOUNTER — Encounter: Payer: Self-pay | Admitting: Critical Care Medicine

## 2019-09-13 VITALS — BP 118/66 | HR 83 | Temp 98.5°F | Ht 65.5 in | Wt 167.2 lb

## 2019-09-13 DIAGNOSIS — R0600 Dyspnea, unspecified: Secondary | ICD-10-CM | POA: Diagnosis not present

## 2019-09-13 DIAGNOSIS — R0609 Other forms of dyspnea: Secondary | ICD-10-CM

## 2019-09-13 MED ORDER — FLOVENT HFA 110 MCG/ACT IN AERO: 1.0000 | INHALATION_SPRAY | Freq: Two times a day (BID) | RESPIRATORY_TRACT | 3 refills | Status: DC

## 2019-09-13 MED ORDER — ALBUTEROL SULFATE HFA 108 (90 BASE) MCG/ACT IN AERS: 2.0000 | INHALATION_SPRAY | RESPIRATORY_TRACT | 3 refills | Status: AC | PRN

## 2019-09-13 NOTE — Patient Instructions (Addendum)
Thank you for visiting Dr. Carlis Abbott at [facility]. We recommend the following: Orders Placed This Encounter  Procedures  . Pulmonary function test   Orders Placed This Encounter  Procedures  . Pulmonary function test    Standing Status:   Future    Standing Expiration Date:   09/12/2020    Order Specific Question:   Where should this test be performed?    Answer:   [facility]    Order Specific Question:   Full PFT: includes the following: basic spirometry, spirometry pre & post bronchodilator, diffusion capacity (DLCO), lung volumes    Answer:   Full PFT    Meds ordered this encounter  Medications  . fluticasone (FLOVENT HFA) 110 MCG/ACT inhaler    Sig: Inhale 1 puff into the lungs 2 (two) times daily.    Dispense:  1 Inhaler    Refill:  3  . albuterol (VENTOLIN HFA) 108 (90 Base) MCG/ACT inhaler    Sig: Inhale 2 puffs into the lungs every 4 (four) hours as needed for wheezing or shortness of breath.    Dispense:  6.7 g    Refill:  3    Return in about 6 weeks (around 10/25/2019).    Please do your part to reduce the spread of COVID-19.

## 2019-09-13 NOTE — Progress Notes (Signed)
Synopsis: Referred in October 2020 for dyspnea by Lewis Moccasin, MD.  Subjective:   PATIENT ID: Gail Price: female DOB: October 27, 1986, MRN: [id number]  Chief Complaint  Patient presents with   Consult    referred by PCP for sob.  Dx'ed with Covid-19 in April 2020.  notes DOE, worse with exertion and while laying down at night.      Gail Price is a 33 year old woman who presents for evaluation of dyspnea on exertion.  Her past medical history is significant for a possible Covid infection in late March/early April 2020.  At that time she underwent Covid testing which was negative, but her PCP felt as though it was a false negative.  At the time she had low-grade fever, cough, and significant dyspnea for about 4 weeks.  This improved for a few weeks temporarily, such that she was able to at least walk around without dyspnea, but for worsening afterwards.  She was never able to return to her baseline level of activity.  When her respiratory symptoms worsened about a month after her infection she also began to notice memory problems, delayed response times, worse fatigue, and personality changes.  She has previously been evaluated by neurology, who referred her for SLP treatment.  Brain MRI was normal.  She has no history of asthma, eczema, or allergies.  There is no family history of blood clots or lung disease other than lung cancer in a grandparent who smoked.  She has dyspnea when walking upstairs or doing activities of daily living.  She recently had to stop to rest while making her bed.  She has an occasional dry cough, which is worse after  exertion.  She has not had wheezing since the original month of her infection.  She describes intermittent substernal chest tightness, which she relates to her breathing difficulties.  She is not having chest pain today.  She recently had an ED visit for chest pain, which was ruled out to be ACS.  She had relief with a GI cocktail, with recurrence  of symptoms after the lidocaine wore off.  These symptoms began after eating.  She has been using her albuterol for chest pain about 2 times per week, which improves the pain, but does not improve her shortness of breath.  She denies heartburn and reflux symptoms.  She has had 2 episodes of waking up gasping for air, both in August, which have not recurred since.  She has had fatigue throughout the day, which is the biggest limitation to her activity level.  She has lost about 15 pounds since March which she attributes to a loss of appetite.  She has occasional nausea, diarrhea, and constipation.  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  She has never smoked or vaped.      Past Medical History:  Diagnosis Date   Anxiety    COVID-19    Depression      Family History  Problem Relation Age of Onset   Colon cancer Paternal Grandmother 59   Hypertension Mother    Lung cancer Paternal Grandfather        tobacco abuse     Past Surgical History:  Procedure Laterality Date   eye surgery Right 1994   reconstruction   WISDOM TOOTH EXTRACTION  Social History   Socioeconomic History   Marital status: Single    Spouse name: Not on file   Number of children: 0   Years of education: Not on file   Highest education level: Not on file  Occupational History   Occupation: mariner  Social Designer, fashion/clothing strain: Not on file   Food insecurity    Worry: Not on file    Inability: Not on file   Transportation needs    Medical: Not on file    Non-medical: Not on file  Tobacco Use   Smoking status: Never Smoker   Smokeless tobacco: Never Used  Substance and Sexual Activity   Alcohol use: Not Currently   Drug use: Not Currently   Sexual activity: Yes    Partners: Male    Birth  control/protection: I.U.D.  Lifestyle   Physical activity    Days per week: Not on file    Minutes per session: Not on file   Stress: Not on file  Relationships   Social connections    Talks on phone: Not on file    Gets together: Not on file    Attends religious service: Not on file    Active member of club or organization: Not on file    Attends meetings of clubs or organizations: Not on file    Relationship status: Not on file   Intimate partner violence    Fear of current or ex partner: Not on file    Emotionally abused: Not on file    Physically abused: Not on file    Forced sexual activity: Not on file  Other Topics Concern   Not on file  Social History Narrative   Right handed      Highest level of edu- some college      Lives with mom and brother     No Known Allergies    There is no immunization history on file for this patient.  Outpatient Medications Prior to Visit  Medication Sig Dispense Refill   ALPRAZolam (XANAX) 0.25 MG tablet Take 0.25 mg by mouth 3 (three) times daily as needed for anxiety or sleep.      Cholecalciferol (VITAMIN D) 50 MCG (2000 UT) tablet Take 2,000 Units by mouth daily.     Multiple Vitamin (MULTIVITAMIN WITH MINERALS) TABS tablet Take 1 tablet by mouth daily.     NUVARING 0.12-0.015 MG/24HR vaginal ring Place 1 applicator vaginally every 30 (thirty) days.     albuterol (VENTOLIN HFA) 108 (90 Base) MCG/ACT inhaler Inhale 2 puffs into the lungs every 4 (four) hours as needed for wheezing or shortness of breath.      amitriptyline (ELAVIL) 25 MG tablet Take 25 mg by mouth at bedtime as needed for sleep.      sertraline (ZOLOFT) 50 MG tablet Take 50 mg by mouth daily.     No facility-administered medications prior to visit.     Review of Systems  Constitutional: Positive for weight loss. Negative for fever.       Sweats & chills frequently, poor appetite  HENT: Negative for congestion.        No postnasal drip  Eyes:  Negative for pain and redness.  Respiratory: Positive for cough and shortness of breath. Negative for sputum production and wheezing.   Cardiovascular: Positive for chest pain. Negative for leg swelling.  Gastrointestinal: Positive for diarrhea and nausea. Negative for heartburn and vomiting.  Genitourinary: Negative.   Musculoskeletal: Negative for joint pain  and myalgias.  Neurological: Positive for headaches. Negative for focal weakness.  Endo/Heme/Allergies: Negative for environmental allergies.  Psychiatric/Behavioral: Positive for memory loss.     Objective:   Vitals:   09/13/19 1337  BP: 118/66  Pulse: 83  Temp: 98.5 F (36.9 C)  TempSrc: Oral  SpO2: 99%  Weight: 167 lb 3.2 oz (75.8 kg)  Height: 5' 5.5" (1.664 m)   99% on  RA BMI Readings from Last 3 Encounters:  09/13/19 27.40 kg/m  08/05/19 26.63 kg/m  07/29/19 26.63 kg/m   Wt Readings from Last 3 Encounters:  09/13/19 167 lb 3.2 oz (75.8 kg)  08/05/19 160 lb (72.6 kg)  07/29/19 160 lb (72.6 kg)    Physical Exam Vitals signs reviewed.  Constitutional:      General: She is not in acute distress.    Appearance: Normal appearance. She is not ill-appearing or diaphoretic.  HENT:     Head: Normocephalic and atraumatic.     Nose:     Comments: Deferred due to masking requirement.    Mouth/Throat:     Comments: Deferred due to masking requirement. Eyes:     General: No scleral icterus. Neck:     Musculoskeletal: Neck supple.  Cardiovascular:     Rate and Rhythm: Normal rate and regular rhythm.     Heart sounds: No murmur.  Pulmonary:     Comments: Breathing comfortably on room air, no tachypnea or conversational dyspnea.  No witnessed coughing.  No accessory lung sounds-clear to auscultation bilaterally. Abdominal:     General: There is no distension.     Palpations: Abdomen is soft.     Tenderness: There is no abdominal tenderness.  Musculoskeletal:        General: No swelling or deformity.   Lymphadenopathy:     Cervical: No cervical adenopathy.  Skin:    General: Skin is warm and dry.     Findings: No rash.  Neurological:     General: No focal deficit present.     Mental Status: She is alert.     Coordination: Coordination normal.  Psychiatric:        Mood and Affect: Mood normal.        Behavior: Behavior normal.      CBC    Component Value Date/Time   WBC 9.3 08/05/2019 1652   RBC 4.67 08/05/2019 1652   HGB 13.6 08/05/2019 1652   HCT 39.9 08/05/2019 1652   PLT 296 08/05/2019 1652   MCV 85.4 08/05/2019 1652   MCH 29.1 08/05/2019 1652   MCHC 34.1 08/05/2019 1652   RDW 12.4 08/05/2019 1652    CHEMISTRY CMP     Component Value Date/Time   NA 137 08/05/2019 1652   K 3.9 08/05/2019 1652   CL 105 08/05/2019 1652   CO2 21 (L) 08/05/2019 1652   GLUCOSE 116 (H) 08/05/2019 1652   BUN 11 08/05/2019 1652   CREATININE 0.72 08/05/2019 1652   CALCIUM 9.6 08/05/2019 1652   GFRNONAA >60 08/05/2019 1652   GFRAA >60 08/05/2019 1652    Covid 07/12/2019- negative COVID 03/15/2019- negative  Chest Imaging- films reviewed: CXR, 2 view 08/05/2019- normal  CT chest 02/2019- [facility]- report only available)- 3mm RLL nodule, no other abnormalities  EKG 08/08/2019- sinus tachycardia, normal axis, normal PR and QRS intervals, prolonged QTC 492.  Normal R wave progression across precordial's.  No ST segment deviation or T wave inversion.  Pulmonary Functions Testing Results: No flowsheet data found.  Assessment & Plan:     ICD-10-CM   1. Dyspnea on exertion  R06.00 Pulmonary function test    albuterol (VENTOLIN HFA) 108 (90 Base) MCG/ACT inhaler    Dyspnea on exertion since previous infection-suspicious for COVID-19 despite negative testing.  Based on her symptoms and her improvement with albuterol, I suspect obstructive lung disease may have been caused by this infection. -Start Flovent twice daily; inhaler training provided.  She is been instructed to  rinse out her mouth after every use. -Continue albuterol as needed. -PFTs-she will hold the Flovent for 2 days prior to PFTs -If her symptoms persist despite inhalers and her PFTs are unrevealing, we may need to evaluate for microvascular disease as a sequela of COVID-19.  Unfortunately there has not been determined to be a good treatment for this at this time, and there has not been any standardized imaging protocols created.  Spectral CT scans have been evaluated in the literature.  Likely a VQ scan would be most helpful to evaluate for distal small vessel disease in the lungs.  RTC in 4 to 6 weeks after PFTs.    Current Outpatient Medications:    albuterol (VENTOLIN HFA) 108 (90 Base) MCG/ACT inhaler, Inhale 2 puffs into the lungs every 4 (four) hours as needed for wheezing or shortness of breath., Disp: 6.7 g, Rfl: 3   ALPRAZolam (XANAX) 0.25 MG tablet, Take 0.25 mg by mouth 3 (three) times daily as needed for anxiety or sleep. , Disp: , Rfl:    Cholecalciferol (VITAMIN D) 50 MCG (2000 UT) tablet, Take 2,000 Units by mouth daily., Disp: , Rfl:    Multiple Vitamin (MULTIVITAMIN WITH MINERALS) TABS tablet, Take 1 tablet by mouth daily., Disp: , Rfl:    NUVARING 0.12-0.015 MG/24HR vaginal ring, Place 1 applicator vaginally every 30 (thirty) days., Disp: , Rfl:    fluticasone (FLOVENT HFA) 110 MCG/ACT inhaler, Inhale 1 puff into the lungs 2 (two) times daily., Disp: 1 Inhaler, Rfl: 3   Steffanie DunnLaura P Annastacia Duba, DO [facility] 09/13/2019 2:14 PM

## 2019-09-19 DIAGNOSIS — J45909 Unspecified asthma, uncomplicated: Secondary | ICD-10-CM | POA: Diagnosis not present

## 2019-09-19 DIAGNOSIS — F331 Major depressive disorder, recurrent, moderate: Secondary | ICD-10-CM | POA: Diagnosis not present

## 2019-09-19 DIAGNOSIS — G4700 Insomnia, unspecified: Secondary | ICD-10-CM | POA: Diagnosis not present

## 2019-09-19 DIAGNOSIS — D485 Neoplasm of uncertain behavior of skin: Secondary | ICD-10-CM | POA: Diagnosis not present

## 2019-09-19 DIAGNOSIS — F411 Generalized anxiety disorder: Secondary | ICD-10-CM | POA: Diagnosis not present

## 2019-09-21 ENCOUNTER — Telehealth: Payer: Self-pay | Admitting: Critical Care Medicine

## 2019-09-21 NOTE — Telephone Encounter (Signed)
Spoke with pt, she states Dr. Carlis Abbott changed her inhaler from Flovent to Advair. She states the Advair made her breathing worse and was so bad she couldn't talk at times. She wanted to let Dr. Carlis Abbott know that she is stopping Advair but will continue Albuterol if needed. She is going to see how it goes for a couple of weeks and if her breathing is worse she will call us to let us know. Dr. Katheran Awe and if you have any recommendations, send the message back to the triage pool.

## 2019-09-21 NOTE — Telephone Encounter (Signed)
LMTCB

## 2019-09-21 NOTE — Telephone Encounter (Signed)
I am very confused. My note says I started flovent, and I checked the order, which is for flovent. Where did the Advair come from? I think she needs flovent. I can't see a phone message after her visit where I changed her to Advair. Is there something I am missing? I am attaching Lauren to check to see if I maybe sent her out on a sample of Advair that I didn't put in my note, but usually that will show up on the med list too (currently the only Advair showing up is what you entered today, which isn't assigned to a physician who ordered it).

## 2019-09-23 NOTE — Telephone Encounter (Signed)
I called pt but there was no answer. No voicemail to leave message.

## 2019-09-26 NOTE — Telephone Encounter (Signed)
ATC pt, there was no answer and I could not leave a message. Will try back. 

## 2019-09-27 NOTE — Telephone Encounter (Signed)
I called pt but there was no answer and VM to leave message. Will try again.

## 2019-09-29 NOTE — Telephone Encounter (Signed)
lmtcb for pt.   Per Dr. Ainsley Spinner note from 09/13/2019: -Start Flovent twice daily; inhaler training provided.  She is been instructed to rinse out her mouth after every use.

## 2019-09-30 ENCOUNTER — Telehealth: Payer: Self-pay | Admitting: Critical Care Medicine

## 2019-09-30 NOTE — Telephone Encounter (Signed)
We have attempted to contact pt several times with no success or call back from pt. Per triage protocol, message will be closed.  

## 2019-09-30 NOTE — Telephone Encounter (Signed)
  I called pt but there was no answer. I couldn't leave message because there is no VM. Will try again later.  Julian Hy, DO to Me . Amado Coe, RN     5:09 PM Note   I am very confused. My note says I started flovent, and I checked the order, which is for flovent. Where did the Advair come from? I think she needs flovent. I can't see a phone message after her visit where I changed her to Advair. Is there something I am missing? I am attaching Lauren to check to see if I maybe sent her out on a sample of Advair that I didn't put in my note, but usually that will show up on the med list too (currently the only Advair showing up is what you entered today, which isn't assigned to a physician who ordered it).

## 2019-09-30 NOTE — Telephone Encounter (Signed)
Pt returning call

## 2019-10-03 DIAGNOSIS — F411 Generalized anxiety disorder: Secondary | ICD-10-CM | POA: Diagnosis not present

## 2019-10-03 DIAGNOSIS — F331 Major depressive disorder, recurrent, moderate: Secondary | ICD-10-CM | POA: Diagnosis not present

## 2019-10-03 NOTE — Telephone Encounter (Signed)
ATC pt, there was no answer and I could not leave a message due to there not being a voicemail. Will try back. 

## 2019-10-04 NOTE — Telephone Encounter (Signed)
Attempted to call pt but line rang and then went to a busy tone. Unable to leave a VM. Will try to call back later.

## 2019-10-05 NOTE — Telephone Encounter (Signed)
ATC pt, there was no answer and I could not leave a message due to there not being a voicemail. Will try back.

## 2019-10-05 NOTE — Telephone Encounter (Signed)
returning call - CB# [phone]

## 2019-10-06 NOTE — Telephone Encounter (Signed)
ATC patient, she did not answer and her VM is not set up. Will call back later.

## 2019-10-07 NOTE — Telephone Encounter (Signed)
Spoke with pt, she states her PCP changed her to Advair because she spoke to her about it. She states the Advair made her worse and feels she is better without the inhalers right now. She still has some SOB but wants to wait to see if she will be ok without them. I will forward to Dr. Carlis Abbott to let her know and to see if she has any other recommendations.

## 2019-10-07 NOTE — Telephone Encounter (Signed)
That makes sense why we couldn't see where the Advair came from. I think Advair is a great medication for her too, but if she doesn't want to take them, that is her choice, and we can talk about it when I see her again. Thanks for clarifying!

## 2019-10-10 DIAGNOSIS — F411 Generalized anxiety disorder: Secondary | ICD-10-CM | POA: Diagnosis not present

## 2019-10-10 DIAGNOSIS — R5383 Other fatigue: Secondary | ICD-10-CM | POA: Diagnosis not present

## 2019-10-10 DIAGNOSIS — J45909 Unspecified asthma, uncomplicated: Secondary | ICD-10-CM | POA: Diagnosis not present

## 2019-10-10 DIAGNOSIS — E559 Vitamin D deficiency, unspecified: Secondary | ICD-10-CM | POA: Diagnosis not present

## 2019-10-12 DIAGNOSIS — D485 Neoplasm of uncertain behavior of skin: Secondary | ICD-10-CM | POA: Diagnosis not present

## 2019-10-12 DIAGNOSIS — D2239 Melanocytic nevi of other parts of face: Secondary | ICD-10-CM | POA: Diagnosis not present

## 2019-10-27 DIAGNOSIS — F411 Generalized anxiety disorder: Secondary | ICD-10-CM | POA: Diagnosis not present

## 2019-10-27 DIAGNOSIS — F331 Major depressive disorder, recurrent, moderate: Secondary | ICD-10-CM | POA: Diagnosis not present

## 2019-10-31 DIAGNOSIS — E038 Other specified hypothyroidism: Secondary | ICD-10-CM | POA: Diagnosis not present

## 2019-10-31 DIAGNOSIS — M6080 Other myositis, unspecified site: Secondary | ICD-10-CM | POA: Diagnosis not present

## 2019-10-31 DIAGNOSIS — M62838 Other muscle spasm: Secondary | ICD-10-CM | POA: Diagnosis not present

## 2019-10-31 DIAGNOSIS — G933 Postviral fatigue syndrome: Secondary | ICD-10-CM | POA: Diagnosis not present

## 2019-11-03 DIAGNOSIS — F331 Major depressive disorder, recurrent, moderate: Secondary | ICD-10-CM | POA: Diagnosis not present

## 2019-11-03 DIAGNOSIS — F411 Generalized anxiety disorder: Secondary | ICD-10-CM | POA: Diagnosis not present

## 2019-11-08 DIAGNOSIS — D509 Iron deficiency anemia, unspecified: Secondary | ICD-10-CM | POA: Diagnosis not present

## 2019-11-08 DIAGNOSIS — D649 Anemia, unspecified: Secondary | ICD-10-CM | POA: Diagnosis not present

## 2019-11-08 DIAGNOSIS — Z13220 Encounter for screening for lipoid disorders: Secondary | ICD-10-CM | POA: Diagnosis not present

## 2019-11-10 DIAGNOSIS — F331 Major depressive disorder, recurrent, moderate: Secondary | ICD-10-CM | POA: Diagnosis not present

## 2019-11-10 DIAGNOSIS — F411 Generalized anxiety disorder: Secondary | ICD-10-CM | POA: Diagnosis not present

## 2019-11-14 DIAGNOSIS — E279 Disorder of adrenal gland, unspecified: Secondary | ICD-10-CM | POA: Diagnosis not present

## 2019-11-14 DIAGNOSIS — E282 Polycystic ovarian syndrome: Secondary | ICD-10-CM | POA: Diagnosis not present

## 2019-11-14 DIAGNOSIS — R7982 Elevated C-reactive protein (CRP): Secondary | ICD-10-CM | POA: Diagnosis not present

## 2019-11-14 DIAGNOSIS — E539 Vitamin B deficiency, unspecified: Secondary | ICD-10-CM | POA: Diagnosis not present

## 2019-11-22 ENCOUNTER — Ambulatory Visit: Payer: BC Managed Care – PPO | Admitting: Critical Care Medicine

## 2019-11-29 ENCOUNTER — Telehealth (INDEPENDENT_AMBULATORY_CARE_PROVIDER_SITE_OTHER): Payer: BC Managed Care – PPO | Admitting: Neurology

## 2019-11-29 ENCOUNTER — Other Ambulatory Visit: Payer: Self-pay

## 2019-11-29 ENCOUNTER — Encounter: Payer: Self-pay | Admitting: Neurology

## 2019-11-29 VITALS — Ht 66.0 in | Wt 165.0 lb

## 2019-11-29 DIAGNOSIS — Z8616 Personal history of COVID-19: Secondary | ICD-10-CM

## 2019-11-29 DIAGNOSIS — F411 Generalized anxiety disorder: Secondary | ICD-10-CM | POA: Diagnosis not present

## 2019-11-29 DIAGNOSIS — F331 Major depressive disorder, recurrent, moderate: Secondary | ICD-10-CM | POA: Diagnosis not present

## 2019-11-29 DIAGNOSIS — R4689 Other symptoms and signs involving appearance and behavior: Secondary | ICD-10-CM

## 2019-11-29 DIAGNOSIS — R4189 Other symptoms and signs involving cognitive functions and awareness: Secondary | ICD-10-CM

## 2019-11-29 DIAGNOSIS — B948 Sequelae of other specified infectious and parasitic diseases: Secondary | ICD-10-CM

## 2019-11-29 NOTE — Progress Notes (Signed)
Virtual Visit via Video Note The purpose of this virtual visit is to provide medical care while limiting exposure to the novel coronavirus.    Consent was obtained for video visit:  Yes.   Answered questions that patient had about telehealth interaction:  Yes.   I discussed the limitations, risks, security and privacy concerns of performing an evaluation and management service by telemedicine. I also discussed with the patient that there may be a patient responsible charge related to this service. The patient expressed understanding and agreed to proceed.  Pt location: Home Physician Location: office Name of referring provider:  Lewis Moccasin, MD I connected with Gail Price at patients initiation/request on 11/29/2019 at 11:30 AM EST by video enabled telemedicine application and verified that I am speaking with the correct person using two identifiers. Pt MRN:  [id number] Pt DOB:  07-23-1986 Video Participants:  Gail Price   History of Present Illness:  The patient was seen as a virtual video visit on 11/29/2019. She was last seen 4 months ago for cognitive changes after Covid-19 infection. She had an MRI brain with and without contrast done 08/2019 which I personally reviewed, no acute changes, normal. She feels a little better but continues to mix up words such as fridge instead of fireplace and has word-finding difficulties. Her memory is so awful she keeps a daily journal to remember what happened. She has terrible problems with panic attacks with waves of major depression making her feel too overwhelmed. She had side effects of tachycardia on sertraline and amitriptyline, currently taking prn Xanax. She takes it once a week on bad weeks because it can exacerbate her depression. She sees a therapist and plans to see a psychiatrist. She has headaches 1-2 times a week, on a bad week she can have a headache for 3 days, mostly over the right eye and frontal region. No nausea/vomiting,  Advil helps. She has occasional dizziness, no falls. She has noticed her resting heart rate has increased from baseline 60 now in the 80s, and after light exercise/walking, it can go up to 110. She has started seeing a different PCP Dr. Eduard Clos, diagnosed with hypothyroidism and now on treatment.   History on Initial Assessment 07/29/2019: This is a pleasant 34 year old right-handed woman with no significant past medical history presenting for evaluation of memory loss. She was diagnosed with Covid-19 last March when she had cough, shortness of breath. Her Covid-19 test was negative, however she had clinical symptoms. She got over her cough in April, but continued to have shortness of breath, as well as cognitive and personality changes.She had significant fatigue, memory and attention difficulties. She has noticed her audio comprehension has worsened. 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 She would leave words out of sentences, forget how to spell, skip lines when reading, forgetting words when she speaks or types. On really bad days, her short term memory is gone. She started having insomnia with 2-6 hours of sleep. She was started on amitriptyline 2 weeks ago, she can sometimes sleep 12 hours, but would still feel very tired, taking 4 hour naps in the day. She sometimes feels like she has ADD, someone would talk to her and she does not notice them. She used to be a very energetic and motivated person, working on the [employer]. She  now has a general disinterest in everything, nothing she used to do is fun anymore. Her appetite is low. She was started on sertraline in June and states she does not feel morose. She took sertraline briefly in college. She takes Xanax once a week if she cannot sleep. She lives with her mother and brother and has not driven since she is so tired.  She denies any missed bills or medications.   Over the past month, she has had mild headaches with pressure in the frontal region lasting several house. They occur 1-2 times a week, no associated nausea/vomiting, vision changes, photo/phonophobia. She has dizziness when standing. She has noticed her heart rate is usually elevated 90-100 bpm, and walking across the house increases it to 113. She still feels short of breath even if sitting, worse with walking. Her O2 sats have been good. No diplopia, dysarthria/dysphagia, focal numbness/tingling/weakness, tremors. She has neck and back pain, and alternating diarrhea and constipation. She did not have any loss of sense of smell or taste with Covid infection. No prior history of head injuries, no family history of memory loss. She very rarely drinks alcohol.  Outpatient Encounter Medications as of 11/29/2019  Medication Sig  . albuterol (VENTOLIN HFA) 108 (90 Base) MCG/ACT inhaler Inhale 2 puffs into the lungs every 4 (four) hours as needed for wheezing or shortness of breath.  . ALPRAZolam (XANAX) 0.25 MG tablet Take 0.25 mg by mouth 3 (three) times daily as needed for anxiety or sleep.   . Cholecalciferol (VITAMIN D) 50 MCG (2000 UT) tablet Take 2,000 Units by mouth daily.  Marland Kitchen CITRUS BERGAMOT PO Take 1,000 mg by mouth daily.  Marland Kitchen DHEA 25 MG CAPS Take 1 capsule by mouth once.  Marland Kitchen ibuprofen (ADVIL) 200 MG tablet Take by mouth.  . liothyronine (CYTOMEL) 5 MCG tablet Take 5 mcg by mouth every morning.  . Magnesium 500 MG TABS Take 500 mg by mouth at bedtime.  . Multiple Vitamin (MULTIVITAMIN WITH MINERALS) TABS tablet Take 1 tablet by mouth daily.  Marland Kitchen NUVARING 0.12-0.015 MG/24HR vaginal ring Place 1 applicator vaginally every 30 (thirty) days.  .    .     No facility-administered encounter medications on file as of 11/29/2019.    Observations/Objective:   Vitals:   11/29/19 0936  Weight: 165 lb (74.8 kg)  Height: 5\' 6"  (1.676 m)   GEN:  The patient  appears stated age and is in NAD  Neurological examination: Patient is awake, alert, oriented x 3. She has occasional word-finding difficulties. No dysarthria. Reduced fluency, intact comprehension. Remote and recent memory impaired.Cranial nerves: Extraocular movements intact with no nystagmus. No facial asymmetry. Motor: moves all extremities symmetrically, at least anti-gravity x 4.   Assessment and Plan:   This is a pleasant 34 yo RH woman presenting with cognitive and personality changes since Covid-19 infection last March 2020. MRI brain normal. We discussed cognitive changes that have been seen with Covid-19, and symptomatic treatment of mood and cognitive issues. Discussed seeing a psychiatrist for the anxiety/depression, in addition to psychotherapy. Proceed with Speech therapy for cognitive rehab. If headaches increase in frequency, we will plan to start a beta-blocker for headache prophylaxis. Follow-up in 6 months, she knows to call for any changes.    Follow Up Instructions:   -I discussed the assessment and treatment plan with the patient. The patient was provided an opportunity to ask questions and all were answered. The patient agreed with the plan and demonstrated an understanding of  the instructions.   The patient was advised to call back or seek an in-person evaluation if the symptoms worsen or if the condition fails to improve as anticipated.    Cameron Sprang, MD

## 2019-12-05 DIAGNOSIS — Z1159 Encounter for screening for other viral diseases: Secondary | ICD-10-CM | POA: Diagnosis not present

## 2019-12-05 DIAGNOSIS — Z139 Encounter for screening, unspecified: Secondary | ICD-10-CM | POA: Diagnosis not present

## 2019-12-05 DIAGNOSIS — M069 Rheumatoid arthritis, unspecified: Secondary | ICD-10-CM | POA: Diagnosis not present

## 2019-12-06 DIAGNOSIS — F411 Generalized anxiety disorder: Secondary | ICD-10-CM | POA: Diagnosis not present

## 2019-12-06 DIAGNOSIS — F331 Major depressive disorder, recurrent, moderate: Secondary | ICD-10-CM | POA: Diagnosis not present

## 2019-12-07 DIAGNOSIS — J45909 Unspecified asthma, uncomplicated: Secondary | ICD-10-CM | POA: Diagnosis not present

## 2019-12-07 DIAGNOSIS — R5383 Other fatigue: Secondary | ICD-10-CM | POA: Diagnosis not present

## 2019-12-07 DIAGNOSIS — U071 COVID-19: Secondary | ICD-10-CM | POA: Diagnosis not present

## 2019-12-07 DIAGNOSIS — H527 Unspecified disorder of refraction: Secondary | ICD-10-CM | POA: Diagnosis not present

## 2019-12-13 DIAGNOSIS — F331 Major depressive disorder, recurrent, moderate: Secondary | ICD-10-CM | POA: Diagnosis not present

## 2019-12-13 DIAGNOSIS — F411 Generalized anxiety disorder: Secondary | ICD-10-CM | POA: Diagnosis not present

## 2019-12-27 DIAGNOSIS — F411 Generalized anxiety disorder: Secondary | ICD-10-CM | POA: Diagnosis not present

## 2019-12-27 DIAGNOSIS — F331 Major depressive disorder, recurrent, moderate: Secondary | ICD-10-CM | POA: Diagnosis not present

## 2020-01-02 DIAGNOSIS — F411 Generalized anxiety disorder: Secondary | ICD-10-CM | POA: Diagnosis not present

## 2020-01-02 DIAGNOSIS — F331 Major depressive disorder, recurrent, moderate: Secondary | ICD-10-CM | POA: Diagnosis not present

## 2020-01-03 DIAGNOSIS — F331 Major depressive disorder, recurrent, moderate: Secondary | ICD-10-CM | POA: Diagnosis not present

## 2020-01-03 DIAGNOSIS — F411 Generalized anxiety disorder: Secondary | ICD-10-CM | POA: Diagnosis not present

## 2020-01-07 ENCOUNTER — Other Ambulatory Visit (HOSPITAL_COMMUNITY): Payer: BC Managed Care – PPO

## 2020-01-10 DIAGNOSIS — F331 Major depressive disorder, recurrent, moderate: Secondary | ICD-10-CM | POA: Diagnosis not present

## 2020-01-10 DIAGNOSIS — F411 Generalized anxiety disorder: Secondary | ICD-10-CM | POA: Diagnosis not present

## 2020-01-11 ENCOUNTER — Ambulatory Visit: Payer: BC Managed Care – PPO | Admitting: Critical Care Medicine

## 2020-01-16 DIAGNOSIS — E559 Vitamin D deficiency, unspecified: Secondary | ICD-10-CM | POA: Diagnosis not present

## 2020-01-16 DIAGNOSIS — F411 Generalized anxiety disorder: Secondary | ICD-10-CM | POA: Diagnosis not present

## 2020-01-16 DIAGNOSIS — R5383 Other fatigue: Secondary | ICD-10-CM | POA: Diagnosis not present

## 2020-01-16 DIAGNOSIS — J45909 Unspecified asthma, uncomplicated: Secondary | ICD-10-CM | POA: Diagnosis not present

## 2020-01-17 DIAGNOSIS — E559 Vitamin D deficiency, unspecified: Secondary | ICD-10-CM | POA: Diagnosis not present

## 2020-01-17 DIAGNOSIS — E279 Disorder of adrenal gland, unspecified: Secondary | ICD-10-CM | POA: Diagnosis not present

## 2020-02-01 DIAGNOSIS — F419 Anxiety disorder, unspecified: Secondary | ICD-10-CM | POA: Diagnosis not present

## 2020-02-01 DIAGNOSIS — D899 Disorder involving the immune mechanism, unspecified: Secondary | ICD-10-CM | POA: Diagnosis not present

## 2020-02-01 DIAGNOSIS — G933 Postviral fatigue syndrome: Secondary | ICD-10-CM | POA: Diagnosis not present

## 2020-02-01 DIAGNOSIS — E063 Autoimmune thyroiditis: Secondary | ICD-10-CM | POA: Diagnosis not present

## 2020-02-01 DIAGNOSIS — R5382 Chronic fatigue, unspecified: Secondary | ICD-10-CM | POA: Diagnosis not present

## 2020-02-01 DIAGNOSIS — E279 Disorder of adrenal gland, unspecified: Secondary | ICD-10-CM | POA: Diagnosis not present

## 2020-02-01 DIAGNOSIS — U071 COVID-19: Secondary | ICD-10-CM | POA: Diagnosis not present

## 2020-02-10 DIAGNOSIS — J159 Unspecified bacterial pneumonia: Secondary | ICD-10-CM | POA: Diagnosis not present

## 2020-02-21 DIAGNOSIS — G933 Postviral fatigue syndrome: Secondary | ICD-10-CM | POA: Diagnosis not present

## 2020-02-21 DIAGNOSIS — R799 Abnormal finding of blood chemistry, unspecified: Secondary | ICD-10-CM | POA: Diagnosis not present

## 2020-02-21 DIAGNOSIS — D899 Disorder involving the immune mechanism, unspecified: Secondary | ICD-10-CM | POA: Diagnosis not present

## 2020-02-29 DIAGNOSIS — Z01419 Encounter for gynecological examination (general) (routine) without abnormal findings: Secondary | ICD-10-CM | POA: Diagnosis not present

## 2020-02-29 DIAGNOSIS — Z6828 Body mass index (BMI) 28.0-28.9, adult: Secondary | ICD-10-CM | POA: Diagnosis not present

## 2020-02-29 DIAGNOSIS — L292 Pruritus vulvae: Secondary | ICD-10-CM | POA: Diagnosis not present

## 2020-02-29 DIAGNOSIS — B373 Candidiasis of vulva and vagina: Secondary | ICD-10-CM | POA: Diagnosis not present

## 2020-03-06 DIAGNOSIS — H5711 Ocular pain, right eye: Secondary | ICD-10-CM | POA: Diagnosis not present

## 2020-03-06 DIAGNOSIS — H52203 Unspecified astigmatism, bilateral: Secondary | ICD-10-CM | POA: Diagnosis not present

## 2020-03-20 DIAGNOSIS — M9901 Segmental and somatic dysfunction of cervical region: Secondary | ICD-10-CM | POA: Diagnosis not present

## 2020-03-20 DIAGNOSIS — M9902 Segmental and somatic dysfunction of thoracic region: Secondary | ICD-10-CM | POA: Diagnosis not present

## 2020-03-20 DIAGNOSIS — F0781 Postconcussional syndrome: Secondary | ICD-10-CM | POA: Diagnosis not present

## 2020-03-20 DIAGNOSIS — H814 Vertigo of central origin: Secondary | ICD-10-CM | POA: Diagnosis not present

## 2020-03-26 DIAGNOSIS — F411 Generalized anxiety disorder: Secondary | ICD-10-CM | POA: Diagnosis not present

## 2020-03-26 DIAGNOSIS — M9902 Segmental and somatic dysfunction of thoracic region: Secondary | ICD-10-CM | POA: Diagnosis not present

## 2020-03-26 DIAGNOSIS — F331 Major depressive disorder, recurrent, moderate: Secondary | ICD-10-CM | POA: Diagnosis not present

## 2020-03-26 DIAGNOSIS — F0781 Postconcussional syndrome: Secondary | ICD-10-CM | POA: Diagnosis not present

## 2020-03-26 DIAGNOSIS — H814 Vertigo of central origin: Secondary | ICD-10-CM | POA: Diagnosis not present

## 2020-03-26 DIAGNOSIS — M9901 Segmental and somatic dysfunction of cervical region: Secondary | ICD-10-CM | POA: Diagnosis not present

## 2020-03-28 DIAGNOSIS — H2000 Unspecified acute and subacute iridocyclitis: Secondary | ICD-10-CM | POA: Diagnosis not present

## 2020-03-30 DIAGNOSIS — M9901 Segmental and somatic dysfunction of cervical region: Secondary | ICD-10-CM | POA: Diagnosis not present

## 2020-03-30 DIAGNOSIS — F0781 Postconcussional syndrome: Secondary | ICD-10-CM | POA: Diagnosis not present

## 2020-03-30 DIAGNOSIS — H814 Vertigo of central origin: Secondary | ICD-10-CM | POA: Diagnosis not present

## 2020-03-30 DIAGNOSIS — M9902 Segmental and somatic dysfunction of thoracic region: Secondary | ICD-10-CM | POA: Diagnosis not present

## 2020-04-02 DIAGNOSIS — M9901 Segmental and somatic dysfunction of cervical region: Secondary | ICD-10-CM | POA: Diagnosis not present

## 2020-04-02 DIAGNOSIS — F0781 Postconcussional syndrome: Secondary | ICD-10-CM | POA: Diagnosis not present

## 2020-04-02 DIAGNOSIS — M9902 Segmental and somatic dysfunction of thoracic region: Secondary | ICD-10-CM | POA: Diagnosis not present

## 2020-04-02 DIAGNOSIS — H814 Vertigo of central origin: Secondary | ICD-10-CM | POA: Diagnosis not present

## 2020-04-03 DIAGNOSIS — D2261 Melanocytic nevi of right upper limb, including shoulder: Secondary | ICD-10-CM | POA: Diagnosis not present

## 2020-04-03 DIAGNOSIS — D225 Melanocytic nevi of trunk: Secondary | ICD-10-CM | POA: Diagnosis not present

## 2020-04-03 DIAGNOSIS — D485 Neoplasm of uncertain behavior of skin: Secondary | ICD-10-CM | POA: Diagnosis not present

## 2020-04-03 DIAGNOSIS — L811 Chloasma: Secondary | ICD-10-CM | POA: Diagnosis not present

## 2020-04-03 DIAGNOSIS — D2221 Melanocytic nevi of right ear and external auricular canal: Secondary | ICD-10-CM | POA: Diagnosis not present

## 2020-04-06 DIAGNOSIS — H814 Vertigo of central origin: Secondary | ICD-10-CM | POA: Diagnosis not present

## 2020-04-06 DIAGNOSIS — M9901 Segmental and somatic dysfunction of cervical region: Secondary | ICD-10-CM | POA: Diagnosis not present

## 2020-04-06 DIAGNOSIS — M9902 Segmental and somatic dysfunction of thoracic region: Secondary | ICD-10-CM | POA: Diagnosis not present

## 2020-04-06 DIAGNOSIS — F0781 Postconcussional syndrome: Secondary | ICD-10-CM | POA: Diagnosis not present

## 2020-04-17 DIAGNOSIS — M9901 Segmental and somatic dysfunction of cervical region: Secondary | ICD-10-CM | POA: Diagnosis not present

## 2020-04-17 DIAGNOSIS — F0781 Postconcussional syndrome: Secondary | ICD-10-CM | POA: Diagnosis not present

## 2020-04-17 DIAGNOSIS — H814 Vertigo of central origin: Secondary | ICD-10-CM | POA: Diagnosis not present

## 2020-04-17 DIAGNOSIS — M9902 Segmental and somatic dysfunction of thoracic region: Secondary | ICD-10-CM | POA: Diagnosis not present

## 2020-04-18 DIAGNOSIS — E039 Hypothyroidism, unspecified: Secondary | ICD-10-CM | POA: Diagnosis not present

## 2020-04-18 DIAGNOSIS — B007 Disseminated herpesviral disease: Secondary | ICD-10-CM | POA: Diagnosis not present

## 2020-04-18 DIAGNOSIS — F5109 Other insomnia not due to a substance or known physiological condition: Secondary | ICD-10-CM | POA: Diagnosis not present

## 2020-04-18 DIAGNOSIS — R5382 Chronic fatigue, unspecified: Secondary | ICD-10-CM | POA: Diagnosis not present

## 2020-05-10 DIAGNOSIS — M9901 Segmental and somatic dysfunction of cervical region: Secondary | ICD-10-CM | POA: Diagnosis not present

## 2020-05-10 DIAGNOSIS — F0781 Postconcussional syndrome: Secondary | ICD-10-CM | POA: Diagnosis not present

## 2020-05-10 DIAGNOSIS — H814 Vertigo of central origin: Secondary | ICD-10-CM | POA: Diagnosis not present

## 2020-05-10 DIAGNOSIS — M9902 Segmental and somatic dysfunction of thoracic region: Secondary | ICD-10-CM | POA: Diagnosis not present

## 2020-05-17 DIAGNOSIS — M9901 Segmental and somatic dysfunction of cervical region: Secondary | ICD-10-CM | POA: Diagnosis not present

## 2020-05-17 DIAGNOSIS — H814 Vertigo of central origin: Secondary | ICD-10-CM | POA: Diagnosis not present

## 2020-05-17 DIAGNOSIS — F0781 Postconcussional syndrome: Secondary | ICD-10-CM | POA: Diagnosis not present

## 2020-05-17 DIAGNOSIS — M9902 Segmental and somatic dysfunction of thoracic region: Secondary | ICD-10-CM | POA: Diagnosis not present

## 2020-05-31 DIAGNOSIS — H814 Vertigo of central origin: Secondary | ICD-10-CM | POA: Diagnosis not present

## 2020-05-31 DIAGNOSIS — M9902 Segmental and somatic dysfunction of thoracic region: Secondary | ICD-10-CM | POA: Diagnosis not present

## 2020-05-31 DIAGNOSIS — M9901 Segmental and somatic dysfunction of cervical region: Secondary | ICD-10-CM | POA: Diagnosis not present

## 2020-05-31 DIAGNOSIS — F0781 Postconcussional syndrome: Secondary | ICD-10-CM | POA: Diagnosis not present

## 2020-06-14 DIAGNOSIS — M9902 Segmental and somatic dysfunction of thoracic region: Secondary | ICD-10-CM | POA: Diagnosis not present

## 2020-06-14 DIAGNOSIS — F0781 Postconcussional syndrome: Secondary | ICD-10-CM | POA: Diagnosis not present

## 2020-06-14 DIAGNOSIS — M9901 Segmental and somatic dysfunction of cervical region: Secondary | ICD-10-CM | POA: Diagnosis not present

## 2020-06-14 DIAGNOSIS — H814 Vertigo of central origin: Secondary | ICD-10-CM | POA: Diagnosis not present

## 2020-06-20 ENCOUNTER — Ambulatory Visit: Payer: BC Managed Care – PPO | Admitting: Neurology

## 2020-07-09 DIAGNOSIS — Z03818 Encounter for observation for suspected exposure to other biological agents ruled out: Secondary | ICD-10-CM | POA: Diagnosis not present

## 2020-07-11 DIAGNOSIS — F0781 Postconcussional syndrome: Secondary | ICD-10-CM | POA: Diagnosis not present

## 2020-07-11 DIAGNOSIS — M9901 Segmental and somatic dysfunction of cervical region: Secondary | ICD-10-CM | POA: Diagnosis not present

## 2020-07-11 DIAGNOSIS — H814 Vertigo of central origin: Secondary | ICD-10-CM | POA: Diagnosis not present

## 2020-07-11 DIAGNOSIS — M9902 Segmental and somatic dysfunction of thoracic region: Secondary | ICD-10-CM | POA: Diagnosis not present

## 2020-07-24 DIAGNOSIS — M9902 Segmental and somatic dysfunction of thoracic region: Secondary | ICD-10-CM | POA: Diagnosis not present

## 2020-07-24 DIAGNOSIS — F0781 Postconcussional syndrome: Secondary | ICD-10-CM | POA: Diagnosis not present

## 2020-07-24 DIAGNOSIS — M9901 Segmental and somatic dysfunction of cervical region: Secondary | ICD-10-CM | POA: Diagnosis not present

## 2020-07-24 DIAGNOSIS — H814 Vertigo of central origin: Secondary | ICD-10-CM | POA: Diagnosis not present

## 2020-08-07 DIAGNOSIS — M9902 Segmental and somatic dysfunction of thoracic region: Secondary | ICD-10-CM | POA: Diagnosis not present

## 2020-08-07 DIAGNOSIS — H814 Vertigo of central origin: Secondary | ICD-10-CM | POA: Diagnosis not present

## 2020-08-07 DIAGNOSIS — F0781 Postconcussional syndrome: Secondary | ICD-10-CM | POA: Diagnosis not present

## 2020-08-07 DIAGNOSIS — M9901 Segmental and somatic dysfunction of cervical region: Secondary | ICD-10-CM | POA: Diagnosis not present

## 2020-08-07 DIAGNOSIS — Z03818 Encounter for observation for suspected exposure to other biological agents ruled out: Secondary | ICD-10-CM | POA: Diagnosis not present

## 2020-08-21 DIAGNOSIS — F0781 Postconcussional syndrome: Secondary | ICD-10-CM | POA: Diagnosis not present

## 2020-08-21 DIAGNOSIS — M9901 Segmental and somatic dysfunction of cervical region: Secondary | ICD-10-CM | POA: Diagnosis not present

## 2020-08-21 DIAGNOSIS — M9902 Segmental and somatic dysfunction of thoracic region: Secondary | ICD-10-CM | POA: Diagnosis not present

## 2020-08-21 DIAGNOSIS — H814 Vertigo of central origin: Secondary | ICD-10-CM | POA: Diagnosis not present

## 2020-08-28 DIAGNOSIS — Z03818 Encounter for observation for suspected exposure to other biological agents ruled out: Secondary | ICD-10-CM | POA: Diagnosis not present

## 2020-09-06 DIAGNOSIS — M9902 Segmental and somatic dysfunction of thoracic region: Secondary | ICD-10-CM | POA: Diagnosis not present

## 2020-09-06 DIAGNOSIS — H814 Vertigo of central origin: Secondary | ICD-10-CM | POA: Diagnosis not present

## 2020-09-06 DIAGNOSIS — M9901 Segmental and somatic dysfunction of cervical region: Secondary | ICD-10-CM | POA: Diagnosis not present

## 2020-09-06 DIAGNOSIS — F0781 Postconcussional syndrome: Secondary | ICD-10-CM | POA: Diagnosis not present

## 2020-09-20 DIAGNOSIS — H814 Vertigo of central origin: Secondary | ICD-10-CM | POA: Diagnosis not present

## 2020-09-20 DIAGNOSIS — M9901 Segmental and somatic dysfunction of cervical region: Secondary | ICD-10-CM | POA: Diagnosis not present

## 2020-09-20 DIAGNOSIS — F0781 Postconcussional syndrome: Secondary | ICD-10-CM | POA: Diagnosis not present

## 2020-09-20 DIAGNOSIS — M9902 Segmental and somatic dysfunction of thoracic region: Secondary | ICD-10-CM | POA: Diagnosis not present

## 2020-09-28 DIAGNOSIS — D899 Disorder involving the immune mechanism, unspecified: Secondary | ICD-10-CM | POA: Diagnosis not present

## 2020-09-28 DIAGNOSIS — B349 Viral infection, unspecified: Secondary | ICD-10-CM | POA: Diagnosis not present

## 2020-09-28 DIAGNOSIS — R799 Abnormal finding of blood chemistry, unspecified: Secondary | ICD-10-CM | POA: Diagnosis not present

## 2020-09-28 DIAGNOSIS — E039 Hypothyroidism, unspecified: Secondary | ICD-10-CM | POA: Diagnosis not present

## 2020-09-28 DIAGNOSIS — Z77120 Contact with and (suspected) exposure to mold (toxic): Secondary | ICD-10-CM | POA: Diagnosis not present

## 2020-09-28 DIAGNOSIS — G933 Postviral fatigue syndrome: Secondary | ICD-10-CM | POA: Diagnosis not present

## 2020-09-28 DIAGNOSIS — J200 Acute bronchitis due to Mycoplasma pneumoniae: Secondary | ICD-10-CM | POA: Diagnosis not present

## 2020-09-28 DIAGNOSIS — R790 Abnormal level of blood mineral: Secondary | ICD-10-CM | POA: Diagnosis not present

## 2020-10-23 DIAGNOSIS — F0781 Postconcussional syndrome: Secondary | ICD-10-CM | POA: Diagnosis not present

## 2020-10-23 DIAGNOSIS — H814 Vertigo of central origin: Secondary | ICD-10-CM | POA: Diagnosis not present

## 2020-10-23 DIAGNOSIS — M9901 Segmental and somatic dysfunction of cervical region: Secondary | ICD-10-CM | POA: Diagnosis not present

## 2020-10-23 DIAGNOSIS — M9902 Segmental and somatic dysfunction of thoracic region: Secondary | ICD-10-CM | POA: Diagnosis not present

## 2020-11-06 DIAGNOSIS — H814 Vertigo of central origin: Secondary | ICD-10-CM | POA: Diagnosis not present

## 2020-11-06 DIAGNOSIS — M9901 Segmental and somatic dysfunction of cervical region: Secondary | ICD-10-CM | POA: Diagnosis not present

## 2020-11-06 DIAGNOSIS — F0781 Postconcussional syndrome: Secondary | ICD-10-CM | POA: Diagnosis not present

## 2020-11-06 DIAGNOSIS — M9902 Segmental and somatic dysfunction of thoracic region: Secondary | ICD-10-CM | POA: Diagnosis not present
